# Patient Record
Sex: Female | Born: 1965 | Race: Asian | Hispanic: No | Smoking: Former smoker
Health system: Southern US, Community
[De-identification: ages and names within clinical notes are randomized; demographics above are authoritative.]

## PROBLEM LIST (undated history)

## (undated) DIAGNOSIS — C801 Malignant (primary) neoplasm, unspecified: Principal | ICD-10-CM

## (undated) DIAGNOSIS — M1990 Unspecified osteoarthritis, unspecified site: Secondary | ICD-10-CM

## (undated) HISTORY — PX: SHOULDER ARTHROTOMY: SHX1050

## (undated) HISTORY — PX: TONSILLECTOMY: SUR1361

## (undated) HISTORY — DX: Unspecified osteoarthritis, unspecified site: M19.90

## (undated) HISTORY — DX: Malignant (primary) neoplasm, unspecified: C80.1

## (undated) HISTORY — PX: APPENDECTOMY: SHX54

---

## 2012-01-22 ENCOUNTER — Emergency Department (HOSPITAL_COMMUNITY): Payer: Self-pay

## 2012-01-22 ENCOUNTER — Telehealth: Payer: Self-pay | Admitting: *Deleted

## 2012-01-22 ENCOUNTER — Emergency Department (HOSPITAL_COMMUNITY)
Admission: EM | Admit: 2012-01-22 | Discharge: 2012-01-22 | Disposition: A | Discharge status: Home / Self Care | Payer: Self-pay | Attending: Emergency Medicine | Admitting: Emergency Medicine

## 2012-01-22 ENCOUNTER — Encounter (HOSPITAL_COMMUNITY): Payer: Self-pay | Admitting: *Deleted

## 2012-01-22 DIAGNOSIS — R000 Tachycardia, unspecified: Secondary | ICD-10-CM | POA: Insufficient documentation

## 2012-01-22 DIAGNOSIS — C7951 Secondary malignant neoplasm of bone: Secondary | ICD-10-CM | POA: Insufficient documentation

## 2012-01-22 DIAGNOSIS — C801 Malignant (primary) neoplasm, unspecified: Secondary | ICD-10-CM | POA: Insufficient documentation

## 2012-01-22 DIAGNOSIS — C50919 Malignant neoplasm of unspecified site of unspecified female breast: Secondary | ICD-10-CM | POA: Insufficient documentation

## 2012-01-22 DIAGNOSIS — C787 Secondary malignant neoplasm of liver and intrahepatic bile duct: Secondary | ICD-10-CM | POA: Insufficient documentation

## 2012-01-22 DIAGNOSIS — E119 Type 2 diabetes mellitus without complications: Secondary | ICD-10-CM | POA: Insufficient documentation

## 2012-01-22 DIAGNOSIS — R030 Elevated blood-pressure reading, without diagnosis of hypertension: Secondary | ICD-10-CM | POA: Insufficient documentation

## 2012-01-22 DIAGNOSIS — R109 Unspecified abdominal pain: Secondary | ICD-10-CM | POA: Insufficient documentation

## 2012-01-22 LAB — URINE MICROSCOPIC-ADD ON

## 2012-01-22 LAB — DIFFERENTIAL
Basophils Relative: 1 % (ref 0–1)
Eosinophils Absolute: 0.3 10*3/uL (ref 0.0–0.7)
Lymphs Abs: 1.7 10*3/uL (ref 0.7–4.0)
Neutro Abs: 5.8 10*3/uL (ref 1.7–7.7)
Neutrophils Relative %: 69 % (ref 43–77)

## 2012-01-22 LAB — URINALYSIS, ROUTINE W REFLEX MICROSCOPIC
Nitrite: NEGATIVE
Specific Gravity, Urine: 1.02 (ref 1.005–1.030)
Urobilinogen, UA: 1 mg/dL (ref 0.0–1.0)
pH: 5.5 (ref 5.0–8.0)

## 2012-01-22 LAB — HEPATIC FUNCTION PANEL
Alkaline Phosphatase: 221 U/L — ABNORMAL HIGH (ref 39–117)
Indirect Bilirubin: 0.4 mg/dL (ref 0.3–0.9)
Total Protein: 6.5 g/dL (ref 6.0–8.3)

## 2012-01-22 LAB — BASIC METABOLIC PANEL
Chloride: 98 mEq/L (ref 96–112)
GFR calc Af Amer: >90 mL/min (ref >90)
GFR calc non Af Amer: >90 mL/min (ref >90)
Potassium: 4.1 mEq/L (ref 3.5–5.1)
Sodium: 135 mEq/L (ref 135–145)

## 2012-01-22 LAB — CBC
Hemoglobin: 15.3 g/dL — ABNORMAL HIGH (ref 12.0–15.0)
MCH: 30.2 pg (ref 26.0–34.0)
MCHC: 35.1 g/dL (ref 30.0–36.0)
Platelets: 246 10*3/uL (ref 150–400)
RBC: 5.06 MIL/uL (ref 3.87–5.11)

## 2012-01-22 LAB — POCT PREGNANCY, URINE: Preg Test, Ur: NEGATIVE

## 2012-01-22 MED ORDER — ONDANSETRON HCL 4 MG/2ML IJ SOLN
4.0000 mg | Freq: Once | INTRAMUSCULAR | Status: AC
  Administered 2012-01-22: 4 mg via INTRAVENOUS
  Filled 2012-01-22: qty 2

## 2012-01-22 MED ORDER — IOHEXOL 300 MG/ML  SOLN
80.0000 mL | Freq: Once | INTRAMUSCULAR | Status: AC | PRN
  Administered 2012-01-22: 80 mL via INTRAVENOUS

## 2012-01-22 MED ORDER — IOHEXOL 300 MG/ML  SOLN
20.0000 mL | INTRAMUSCULAR | Status: AC
  Administered 2012-01-22 (×2): 20 mL via ORAL

## 2012-01-22 MED ORDER — SODIUM CHLORIDE 0.9 % IV SOLN
Freq: Once | INTRAVENOUS | Status: AC
  Administered 2012-01-22: 125 mL/h via INTRAVENOUS

## 2012-01-22 MED ORDER — OXYCODONE-ACETAMINOPHEN 5-325 MG PO TABS: 1.0000 | ORAL_TABLET | ORAL | Status: DC | PRN

## 2012-01-22 MED ORDER — HYDROMORPHONE HCL PF 1 MG/ML IJ SOLN
1.0000 mg | Freq: Once | INTRAMUSCULAR | Status: AC
  Administered 2012-01-22: 1 mg via INTRAVENOUS
  Filled 2012-01-22: qty 1

## 2012-01-22 NOTE — ED Provider Notes (Addendum)
History     CSN: [id number]  Arrival date & time 01/22/12  0806   First MD Initiated Contact with Patient 01/22/12 [phone]      Chief Complaint  Patient presents with  . Flank Pain    (Consider location/radiation/quality/duration/timing/severity/associated sxs/prior treatment) Patient is a 46 y.o. female presenting with flank pain. The history is provided by the patient.  Flank Pain  She was awakened at 3 AM by severe pain in the right flank. Pain is dull and throbbing and does not radiate. There is no associated fever, chills, sweats. She denies nausea, vomiting, diarrhea. She denies dysuria. She's never had pain like this before. Nothing makes it better nothing makes it worse. She rates the pain at 8/10. She took a dose of Aleve with no benefit.  Past Medical History  Diagnosis Date  . Diabetes mellitus     Past Surgical History  Procedure Date  . Shoulder arthrotomy   . Tonsillectomy   . Appendectomy   . Cesarean section     No family history on file.  History  Substance Use Topics  . Smoking status: Not on file  . Smokeless tobacco: Not on file  . Alcohol Use: No    OB History    Grav Para Term Preterm Abortions TAB SAB Ect Mult Living                  Review of Systems  Genitourinary: Positive for flank pain.  All other systems reviewed and are negative.    Allergies  Cephalosporins  Home Medications  No current outpatient prescriptions on file.  BP 171/83  Pulse 102  Temp(Src) 98.2 F (36.8 C) (Oral)  Resp 18  SpO2 97%  Physical Exam  Nursing note and vitals reviewed.  46 year old female appears uncomfortable. Vital signs are significant for mild tachycardia with heart rate of 102, and moderate hypertension with blood pressure 171/83. Oxygen saturation is 97% which is normal. Head is normocephalic and atraumatic. PERRLA, EOMI. Oropharynx is clear. Neck is nontender and supple. Back has no midline tenderness but there is moderate right CVA  tenderness. Lungs are clear without rales, wheezes, rhonchi. Heart has regular rate rhythm without murmur. Abdomen is soft, flat, nontender without masses or hepatosplenomegaly. Peristalsis is decreased. Extremities have full range of motion, no cyanosis or edema. Skin is warm and dry without rash. Neurologic: Mental status is normal, cranial nerves are intact, there are no focal motor or sensory deficits.  ED Course  Procedures (including critical care time)  Results for orders placed during the hospital encounter of 01/22/12  CBC      Component Value Range   WBC 8.5  4.0 - 10.5 (K/uL)   RBC 5.06  3.87 - 5.11 (MIL/uL)   Hemoglobin 15.3 (*) 12.0 - 15.0 (g/dL)   HCT 11.9  14.7 - 82.9 (%)   MCV 86.2  78.0 - 100.0 (fL)   MCH 30.2  26.0 - 34.0 (pg)   MCHC 35.1  30.0 - 36.0 (g/dL)   RDW 56.2  13.0 - 86.5 (%)   Platelets 246  150 - 400 (K/uL)  DIFFERENTIAL      Component Value Range   Neutrophils Relative 69  43 - 77 (%)   Neutro Abs 5.8  1.7 - 7.7 (K/uL)   Lymphocytes Relative 21  12 - 46 (%)   Lymphs Abs 1.7  0.7 - 4.0 (K/uL)   Monocytes Relative 7  3 - 12 (%)   Monocytes Absolute 0.6  0.1 -  1.0 (K/uL)   Eosinophils Relative 3  0 - 5 (%)   Eosinophils Absolute 0.3  0.0 - 0.7 (K/uL)   Basophils Relative 1  0 - 1 (%)   Basophils Absolute 0.1  0.0 - 0.1 (K/uL)  BASIC METABOLIC PANEL      Component Value Range   Sodium 135  135 - 145 (mEq/L)   Potassium 4.1  3.5 - 5.1 (mEq/L)   Chloride 98  96 - 112 (mEq/L)   CO2 24  19 - 32 (mEq/L)   Glucose, Bld 251 (*) 70 - 99 (mg/dL)   BUN 12  6 - 23 (mg/dL)   Creatinine, Ser 4.09  0.50 - 1.10 (mg/dL)   Calcium 9.7  8.4 - 81.1 (mg/dL)   GFR calc non Af Amer >90  >90 (mL/min)   GFR calc Af Amer >90  >90 (mL/min)  URINALYSIS, ROUTINE W REFLEX MICROSCOPIC      Component Value Range   Color, Urine AMBER (*) YELLOW    APPearance CLOUDY (*) CLEAR    Specific Gravity, Urine 1.020  1.005 - 1.030    pH 5.5  5.0 - 8.0    Glucose, UA 500 (*) NEGATIVE  (mg/dL)   Hgb urine dipstick NEGATIVE  NEGATIVE    Bilirubin Urine SMALL (*) NEGATIVE    Ketones, ur NEGATIVE  NEGATIVE (mg/dL)   Protein, ur NEGATIVE  NEGATIVE (mg/dL)   Urobilinogen, UA 1.0  0.0 - 1.0 (mg/dL)   Nitrite NEGATIVE  NEGATIVE    Leukocytes, UA SMALL (*) NEGATIVE   POCT PREGNANCY, URINE      Component Value Range   Preg Test, Ur NEGATIVE  NEGATIVE   HEPATIC FUNCTION PANEL      Component Value Range   Total Protein 6.5  6.0 - 8.3 (g/dL)   Albumin 3.1 (*) 3.5 - 5.2 (g/dL)   AST 60 (*) 0 - 37 (U/L)   ALT 38 (*) 0 - 35 (U/L)   Alkaline Phosphatase 221 (*) 39 - 117 (U/L)   Total Bilirubin 0.6  0.3 - 1.2 (mg/dL)   Bilirubin, Direct 0.2  0.0 - 0.3 (mg/dL)   Indirect Bilirubin 0.4  0.3 - 0.9 (mg/dL)  URINE MICROSCOPIC-ADD ON      Component Value Range   Squamous Epithelial / LPF MANY (*) RARE    WBC, UA 7-10  <3 (WBC/hpf)   RBC / HPF 0-2  <3 (RBC/hpf)   Bacteria, UA RARE  RARE    Urine-Other MUCOUS PRESENT     Ct Abdomen Pelvis Wo Contrast  01/22/2012  *RADIOLOGY REPORT*  Clinical Data: Right flank pain, rule out kidney stone  CT ABDOMEN AND PELVIS WITHOUT CONTRAST  Technique:  Multidetector CT imaging of the abdomen and pelvis was performed following the standard protocol without intravenous contrast.  Comparison: Pain  Findings: The lung bases are unremarkable.  Sagittal images of the spine shows mild degenerative changes.  There is a lytic lesion in the L1 vertebral body measures 8 mm.  Lytic lesion in the L2 vertebral body measures 10 mm.  The small lytic lesion in the S3 vertebral body measures 5 mm.  Lytic lesion in the L4 vertebral body measures 7 mm.  Clinical correlation is necessary to exclude metastatic disease.  Multiple low density lesions are noted throughout the liver is suspicious for metastatic disease.  No calcified gallstones are noted within gallbladder.  Kidneys are symmetrical in size.  No nephrolithiasis.  No hydronephrosis or hydroureter.  No calcified  ureteral calculi are  noted. The pancreas, spleen and adrenal glands are unremarkable.  No small bowel obstruction.  No ascites or free air.  No adenopathy.  There is no pericecal inflammation.  The unenhanced uterus and adnexa are unremarkable.  No colonic obstruction.  The urinary bladder is under distended grossly unremarkable. Question lytic lesion right sacrum measures 1.7 cm.  IMPRESSION:  1.Multiple low density lesions within liver highly suspicious for metastatic disease.  Correlation with enhanced CT or MRI is recommended. 2. There are lytic lesions multiple vertebral body highly suspicious for metastatic disease.  Correlation with bone scan is recommended. Question lytic lesion right sacrum. 3.  No nephrolithiasis.  No hydronephrosis or hydroureter. 4.  No pericecal inflammation.  I discussed immediately with Dr. Preston Fleeting  Original Report Authenticated By: Natasha Mead, M.D.   Ct Chest W Contrast  01/22/2012  *RADIOLOGY REPORT*  Clinical Data:  Liver lesions  CT CHEST, ABDOMEN AND PELVIS WITH CONTRAST  Technique:  Multidetector CT imaging of the chest, abdomen and pelvis was performed following the standard protocol during bolus administration of intravenous contrast.  Contrast: 80mL OMNIPAQUE IOHEXOL 300 MG/ML  SOLN, 1 OMNIPAQUE IOHEXOL 300 MG/ML  SOLN  Comparison:   None.  CT CHEST  Findings:  2.8 cm mass in the right breast with spiculated margins. Abnormal right axillary adenopathy.  14 mm right axillary node on image 11.  14 mm node in the right axilla on image #4.  Other smaller adjacent nodes are evident.  Negative abnormal mediastinal adenopathy.  No pleural effusion.  No pericardial effusion.  Mild diffuse esophageal wall thickening.  Irregular 11 mm irregular pulmonary parenchymal density with spiculations in the right middle lobe adjacent to the minor fissure.  Lytic lesion in the upper sternum on image 16.  Several lytic lesions throughout the thoracic vertebral bodies.  No evidence of pathologic  fracture.  No obvious epidural spread of tumor.  IMPRESSION: Right breast mass and associated right axillary adenopathy worrisome for breast carcinoma.  Multiple lytic bone lesions.  Irregular spiculated pulmonary parenchymal density in the right middle lobe.  This may also represent carcinoma.  CT ABDOMEN AND PELVIS  Findings:  Healing lesions are seen throughout the liver worrisome for metastatic disease.  Contour of the liver is nodular.  This is a nonspecific finding.  Enhancement of the spleen is somewhat heterogeneous.  Kidney is adrenal glands, pancreas are within normal limits.  No free fluid. Small para-aortic nodes are present.  1.6 cm short axis diameter portacaval node.  Other smaller celiac nodes are present.  Innumerable lytic bone lesions throughout the thoracolumbar spine. Lytic lesion in the left ischium on image 114.  Uterus is enlarged.  Bladder and adnexa are within normal limits.  IMPRESSION: Multiple liver lesions worrisome for metastatic disease.  Enlarged retroperitoneal nodes in the upper abdomen.  Multiple lytic bone lesions worrisome for metastatic disease.  Further workup in this patient can be achieved with mammography and PET CT.  Original Report Authenticated By: Donavan Burnet, M.D.   Ct Abdomen Pelvis W Contrast  01/22/2012  *RADIOLOGY REPORT*  Clinical Data:  Liver lesions  CT CHEST, ABDOMEN AND PELVIS WITH CONTRAST  Technique:  Multidetector CT imaging of the chest, abdomen and pelvis was performed following the standard protocol during bolus administration of intravenous contrast.  Contrast: 80mL OMNIPAQUE IOHEXOL 300 MG/ML  SOLN, 1 OMNIPAQUE IOHEXOL 300 MG/ML  SOLN  Comparison:   None.  CT CHEST  Findings:  2.8 cm mass in the right breast with spiculated margins.  Abnormal right axillary adenopathy.  14 mm right axillary node on image 11.  14 mm node in the right axilla on image #4.  Other smaller adjacent nodes are evident.  Negative abnormal mediastinal adenopathy.  No  pleural effusion.  No pericardial effusion.  Mild diffuse esophageal wall thickening.  Irregular 11 mm irregular pulmonary parenchymal density with spiculations in the right middle lobe adjacent to the minor fissure.  Lytic lesion in the upper sternum on image 16.  Several lytic lesions throughout the thoracic vertebral bodies.  No evidence of pathologic fracture.  No obvious epidural spread of tumor.  IMPRESSION: Right breast mass and associated right axillary adenopathy worrisome for breast carcinoma.  Multiple lytic bone lesions.  Irregular spiculated pulmonary parenchymal density in the right middle lobe.  This may also represent carcinoma.  CT ABDOMEN AND PELVIS  Findings:  Healing lesions are seen throughout the liver worrisome for metastatic disease.  Contour of the liver is nodular.  This is a nonspecific finding.  Enhancement of the spleen is somewhat heterogeneous.  Kidney is adrenal glands, pancreas are within normal limits.  No free fluid. Small para-aortic nodes are present.  1.6 cm short axis diameter portacaval node.  Other smaller celiac nodes are present.  Innumerable lytic bone lesions throughout the thoracolumbar spine. Lytic lesion in the left ischium on image 114.  Uterus is enlarged.  Bladder and adnexa are within normal limits.  IMPRESSION: Multiple liver lesions worrisome for metastatic disease.  Enlarged retroperitoneal nodes in the upper abdomen.  Multiple lytic bone lesions worrisome for metastatic disease.  Further workup in this patient can be achieved with mammography and PET CT.  Original Report Authenticated By: Donavan Burnet, M.D.      1. Flank pain   2. Breast cancer   3. Liver metastases   4. Bone metastases       MDM  Flank pain which may represent a kidney stone. She'll be given IV fluids, IV pain medication and CT scan will be obtained to evaluate for possible ureterolithiasis.  CT did not show evidence of ureterolithiasis. Radiologist noted evidence of liver  and bone metastases so CT was repeated with contrast and including the chest which showed a probable primary cancer in the right breast as well as possible lung metastasis. Patient has no PCP and is new to the area. I have contacted Dr. Truett Perna of oncology service who states that they can get her seen in the oncology clinic in the next week. She is sent home with a prescription for Percocet for pain.      Dione Booze, MD 01/22/12 1327  Patient was informed of the CT findings and stated she was aware of a breast lump for the last month and had been trying to get in to see a physician during that time but had been unable to do so.  Dione Booze, MD 01/22/12 1331

## 2012-01-22 NOTE — ED Notes (Signed)
Pt placed in gown, on monitor with continuous blood pressure and pulse oximetry 

## 2012-01-22 NOTE — ED Notes (Signed)
Pt was awakened by right sided flank pain at 0300. Pt denies difficult urination.

## 2012-01-22 NOTE — ED Notes (Signed)
Bag lunch given

## 2012-01-22 NOTE — Discharge Instructions (Signed)
Your workup shows that you have a breast cancer which has spread to your liver and bones. I have contacted Dr. Elnita Maxwell of our oncology service and they will contact you this week for an initial evaluation. In the meantime, take Percocet as needed for pain. Return to the emergency department if you have any problems.  Acetaminophen; Oxycodone tablets What is this medicine? ACETAMINOPHEN; OXYCODONE (a set a MEE noe fen; ox i KOE done) is a pain reliever. It is used to treat mild to moderate pain. This medicine may be used for other purposes; ask your health care provider or pharmacist if you have questions. What should I tell my health care provider before I take this medicine? They need to know if you have any of these conditions: -brain tumor -Crohn's disease, inflammatory bowel disease, or ulcerative colitis -drink more than 3 alcohol containing drinks per day -drug abuse or addiction -head injury -heart or circulation problems -kidney disease or problems going to the bathroom -liver disease -lung disease, asthma, or breathing problems -an unusual or allergic reaction to acetaminophen, oxycodone, other opioid analgesics, other medicines, foods, dyes, or preservatives -pregnant or trying to get pregnant -breast-feeding How should I use this medicine? Take this medicine by mouth with a full glass of water. Follow the directions on the prescription label. Take your medicine at regular intervals. Do not take your medicine more often than directed. Talk to your pediatrician regarding the use of this medicine in children. Special care may be needed. Patients over 14 years old may have a stronger reaction and need a smaller dose. Overdosage: If you think you have taken too much of this medicine contact a poison control center or emergency room at once. NOTE: This medicine is only for you. Do not share this medicine with others. What if I miss a dose? If you miss a dose, take it as soon as you  can. If it is almost time for your next dose, take only that dose. Do not take double or extra doses. What may interact with this medicine? -alcohol or medicines that contain alcohol -antihistamines -barbiturates like amobarbital, butalbital, butabarbital, methohexital, pentobarbital, phenobarbital, thiopental, and secobarbital -benztropine -drugs for bladder problems like solifenacin, trospium, oxybutynin, tolterodine, hyoscyamine, and methscopolamine -drugs for breathing problems like ipratropium and tiotropium -drugs for certain stomach or intestine problems like propantheline, homatropine methylbromide, glycopyrrolate, atropine, belladonna, and dicyclomine -general anesthetics like etomidate, ketamine, nitrous oxide, propofol, desflurane, enflurane, halothane, isoflurane, and sevoflurane -medicines for depression, anxiety, or psychotic disturbances -medicines for pain like codeine, morphine, pentazocine, buprenorphine, butorphanol, nalbuphine, tramadol, and propoxyphene -medicines for sleep -muscle relaxants -naltrexone -phenothiazines like perphenazine, thioridazine, chlorpromazine, mesoridazine, fluphenazine, prochlorperazine, promazine, and trifluoperazine -scopolamine -trihexyphenidyl This list may not describe all possible interactions. Give your health care provider a list of all the medicines, herbs, non-prescription drugs, or dietary supplements you use. Also tell them if you smoke, drink alcohol, or use illegal drugs. Some items may interact with your medicine. What should I watch for while using this medicine? Tell your doctor or health care professional if your pain does not go away, if it gets worse, or if you have new or a different type of pain. You may develop tolerance to the medicine. Tolerance means that you will need a higher dose of the medication for pain relief. Tolerance is normal and is expected if you take this medicine for a long time. Do not suddenly stop taking  your medicine because you may develop a severe reaction. Your body becomes  used to the medicine. This does NOT mean you are addicted. Addiction is a behavior related to getting and using a drug for a nonmedical reason. If you have pain, you have a medical reason to take pain medicine. Your doctor will tell you how much medicine to take. If your doctor wants you to stop the medicine, the dose will be slowly lowered over time to avoid any side effects. You may get drowsy or dizzy. Do not drive, use machinery, or do anything that needs mental alertness until you know how this medicine affects you. Do not stand or sit up quickly, especially if you are an older patient. This reduces the risk of dizzy or fainting spells. Alcohol may interfere with the effect of this medicine. Avoid alcoholic drinks. The medicine will cause constipation. Try to have a bowel movement at least every 2 to 3 days. If you do not have a bowel movement for 3 days, call your doctor or health care professional. Do not take Tylenol (acetaminophen) or medicines that have acetaminophen with this medicine. Too much acetaminophen can be very dangerous. Many nonprescription medicines contain acetaminophen. Always read the labels carefully to avoid taking more acetaminophen. What side effects may I notice from receiving this medicine? Side effects that you should report to your doctor or health care professional as soon as possible: -allergic reactions like skin rash, itching or hives, swelling of the face, lips, or tongue -breathing difficulties, wheezing -confusion -light headedness or fainting spells -severe stomach pain -yellowing of the skin or the whites of the eyes Side effects that usually do not require medical attention (report to your doctor or health care professional if they continue or are bothersome): -dizziness -drowsiness -nausea -vomiting This list may not describe all possible side effects. Call your doctor for medical  advice about side effects. You may report side effects to FDA at 1-800-FDA-1088. Where should I keep my medicine? Keep out of the reach of children. This medicine can be abused. Keep your medicine in a safe place to protect it from theft. Do not share this medicine with anyone. Selling or giving away this medicine is dangerous and against the law. Store at room temperature between 20 and 25 degrees C (68 and 77 degrees F). Keep container tightly closed. Protect from light. Flush any unused medicines down the toilet. Do not use the medicine after the expiration date. NOTE: This sheet is a summary. It may not cover all possible information. If you have questions about this medicine, talk to your doctor, pharmacist, or health care provider.  2012, [employer]. (07/20/2008 10:01:21 AM)

## 2012-01-22 NOTE — Telephone Encounter (Signed)
Gave pt appt fro 11/23/11 at 1:00 for registration and lab, 1:30 to see Dr. Welton Flakes.

## 2012-01-23 ENCOUNTER — Encounter: Payer: Self-pay | Admitting: Oncology

## 2012-01-23 ENCOUNTER — Other Ambulatory Visit: Payer: Self-pay | Admitting: Lab

## 2012-01-23 ENCOUNTER — Ambulatory Visit (HOSPITAL_BASED_OUTPATIENT_CLINIC_OR_DEPARTMENT_OTHER): Payer: Self-pay | Admitting: Oncology

## 2012-01-23 ENCOUNTER — Ambulatory Visit: Payer: Self-pay | Admitting: Lab

## 2012-01-23 ENCOUNTER — Ambulatory Visit: Payer: Self-pay

## 2012-01-23 VITALS — BP 139/81 | HR 89 | Temp 99.1°F | Ht 66.0 in | Wt 215.8 lb

## 2012-01-23 DIAGNOSIS — C801 Malignant (primary) neoplasm, unspecified: Secondary | ICD-10-CM

## 2012-01-23 HISTORY — DX: Malignant (primary) neoplasm, unspecified: C80.1

## 2012-01-23 LAB — CBC WITH DIFFERENTIAL/PLATELET
Basophils Absolute: 0 10*3/uL (ref 0.0–0.1)
EOS%: 2.9 % (ref 0.0–7.0)
HGB: 14 g/dL (ref 11.6–15.9)
MCH: 29.5 pg (ref 25.1–34.0)
MCHC: 33.3 g/dL (ref 31.5–36.0)
MCV: 88.6 fL (ref 79.5–101.0)
MONO%: 6.5 % (ref 0.0–14.0)
RDW: 13 % (ref 11.2–14.5)

## 2012-01-23 LAB — COMPREHENSIVE METABOLIC PANEL
Alkaline Phosphatase: 257 U/L — ABNORMAL HIGH (ref 39–117)
BUN: 12 mg/dL (ref 6–23)
Glucose, Bld: 338 mg/dL — ABNORMAL HIGH (ref 70–99)
Sodium: 134 mEq/L — ABNORMAL LOW (ref 135–145)
Total Bilirubin: 0.5 mg/dL (ref 0.3–1.2)
Total Protein: 6.9 g/dL (ref 6.0–8.3)

## 2012-01-23 LAB — CANCER ANTIGEN 27.29: CA 27.29: 98 U/mL — ABNORMAL HIGH (ref 0–39)

## 2012-01-23 MED ORDER — OXYCODONE HCL 5 MG PO TABS: 15.0000 mg | ORAL_TABLET | Freq: Three times a day (TID) | ORAL | Status: DC | PRN

## 2012-01-23 NOTE — Progress Notes (Signed)
Vicki Sanders [id number] 02/15/1966 46 y.o. 01/23/2012 2:44 PM  CC Dr. Dione Booze  REASON FOR CONSULTATION:   Work up for possible malignancy  STAGE:   No matching staging information was found for the patient.  REFERRING PHYSICIAN: Dr. Dione Booze  HISTORY OF PRESENT ILLNESS:  Vicki Sanders is a 46 y.o. female with history of chronic back pain for 15 years. Yesterday upon awakening had very severe pain not releived by [employer]. Pain located on the lower back side, No nausea or vomiting associated with it, not jaundiced. Patient has not had a mammogram at all. She had felt a mass in her right breast she tells me that it was for several months now but less than a year. She has not had any discharge from that mass no pain. Patient denies having any cough hemoptysis hematemesis. She is a current smoker. She denies any nausea vomiting abdominal pain no diarrhea or constipation no hematuria hematochezia or melena. She does not have any lower extremity swelling no peripheral paresthesias. Remainder of the 14 point review of systems is negative.   Past Medical History: Past Medical History  Diagnosis Date  . Diabetes mellitus   . Arthritis   . Cancer 01/23/2012    Past Surgical History: Past Surgical History  Procedure Date  . Shoulder arthrotomy   . Tonsillectomy   . Appendectomy   . Cesarean section     Family History: Family History  Problem Relation Age of Onset  . Cancer Mother 25    lung cancer  . Diabetes Father   . Diabetes Sister   . Cancer Maternal Aunt     bilateral cancers, mastectomy  . Cancer Paternal Grandmother     bone cancer    Social History: single with 2 children (27 and 9) History  Substance Use Topics  . Smoking status: Former Smoker -- 1.0 packs/day    Quit date: 01/23/2012  . Smokeless tobacco: Never Used  . Alcohol Use: No    Allergies: Allergies  Allergen Reactions  . Cephalosporins Hives and Itching    Current  Medications: Current Outpatient Prescriptions  Medication Sig Dispense Refill  . oxyCODONE-acetaminophen (PERCOCET) 5-325 MG per tablet Take 1 tablet by mouth every 4 (four) hours as needed for pain.  20 tablet  0    OB/GYN History: menarche at 60, premenopausal, LMP 12/27/11. [id number], first live born at 45 years  Fertility Discussion: completed family Prior History of Cancer: none  Health Maintenance:  Colonoscopy no Bone Density none Last PAP smear: 2010  ECOG PERFORMANCE STATUS: 1 - Symptomatic but completely ambulatory  Genetic Counseling/testing:  Patient will require genetic testing and will refer her as soon as a diagnosis of malignancy  REVIEW OF SYSTEMS:  Constitutional: positive for fatigue and headaches, no walking problems Ears, nose, mouth, throat, and face: negative Respiratory: negative Cardiovascular: negative Gastrointestinal: positive for dyspepsia, nausea and reflux symptoms Genitourinary:negative Integument/breast: positive for breast lump and breast tenderness Musculoskeletal:positive for arthralgias and back pain Neurological: positive for headaches and memory problems Endocrine: positive for diabetic symptoms including increased fatigue  PHYSICAL EXAMINATION: Blood pressure 139/81, pulse 89, temperature 99.1 F (37.3 C), temperature source Oral, height 5\' 6"  (1.676 m), weight 215 lb 12.8 oz (97.886 kg), last menstrual period 12/27/2011.  BJY:NWGNF, healthy, no distress and flushed SKIN: skin color, texture, turgor are normal HEAD: Normocephalic EYES: PERRLA, EOMI, sclera clear EARS: External ears normal OROPHARYNX:no exudate, no erythema and edentulous  NECK: supple, no adenopathy, thyroid normal size, non-tender, without nodularity  LYMPH:  enlarged lymph nodes palpated In the right axilla but there is no supraclavicular or left axillary lymph node no inguinal lymph node. BREAST: 3-4 cm palpable breast mass is noted in the right breast There is some  puckering of the skin there is no nipple discharge. No redness to the skin. Left breast no masses or nipple discharge. LUNGS: clear to auscultation and percussion HEART: regular rate & rhythm, no murmurs and no gallops ABDOMEN:abdomen soft, non-tender, obese, normal bowel sounds and no masses or organomegaly BACK: Back symmetric, no curvature., No CVA tenderness EXTREMITIES:no edema, no clubbing, no cyanosis  NEURO: alert & oriented x 3 with fluent speech, no focal motor/sensory deficits, gait normal, reflexes normal and symmetric  STUDIES/RESULTS: Ct Abdomen Pelvis Wo Contrast  01/22/2012  *RADIOLOGY REPORT*  Clinical Data: Right flank pain, rule out kidney stone  CT ABDOMEN AND PELVIS WITHOUT CONTRAST  Technique:  Multidetector CT imaging of the abdomen and pelvis was performed following the standard protocol without intravenous contrast.  Comparison: Pain  Findings: The lung bases are unremarkable.  Sagittal images of the spine shows mild degenerative changes.  There is a lytic lesion in the L1 vertebral body measures 8 mm.  Lytic lesion in the L2 vertebral body measures 10 mm.  The small lytic lesion in the S3 vertebral body measures 5 mm.  Lytic lesion in the L4 vertebral body measures 7 mm.  Clinical correlation is necessary to exclude metastatic disease.  Multiple low density lesions are noted throughout the liver is suspicious for metastatic disease.  No calcified gallstones are noted within gallbladder.  Kidneys are symmetrical in size.  No nephrolithiasis.  No hydronephrosis or hydroureter.  No calcified ureteral calculi are noted. The pancreas, spleen and adrenal glands are unremarkable.  No small bowel obstruction.  No ascites or free air.  No adenopathy.  There is no pericecal inflammation.  The unenhanced uterus and adnexa are unremarkable.  No colonic obstruction.  The urinary bladder is under distended grossly unremarkable. Question lytic lesion right sacrum measures 1.7 cm.  IMPRESSION:   1.Multiple low density lesions within liver highly suspicious for metastatic disease.  Correlation with enhanced CT or MRI is recommended. 2. There are lytic lesions multiple vertebral body highly suspicious for metastatic disease.  Correlation with bone scan is recommended. Question lytic lesion right sacrum. 3.  No nephrolithiasis.  No hydronephrosis or hydroureter. 4.  No pericecal inflammation.  I discussed immediately with Dr. Preston Fleeting  Original Report Authenticated By: Natasha Mead, M.D.   Ct Chest W Contrast  01/22/2012  *RADIOLOGY REPORT*  Clinical Data:  Liver lesions  CT CHEST, ABDOMEN AND PELVIS WITH CONTRAST  Technique:  Multidetector CT imaging of the chest, abdomen and pelvis was performed following the standard protocol during bolus administration of intravenous contrast.  Contrast: 80mL OMNIPAQUE IOHEXOL 300 MG/ML  SOLN, 1 OMNIPAQUE IOHEXOL 300 MG/ML  SOLN  Comparison:   None.  CT CHEST  Findings:  2.8 cm mass in the right breast with spiculated margins. Abnormal right axillary adenopathy.  14 mm right axillary node on image 11.  14 mm node in the right axilla on image #4.  Other smaller adjacent nodes are evident.  Negative abnormal mediastinal adenopathy.  No pleural effusion.  No pericardial effusion.  Mild diffuse esophageal wall thickening.  Irregular 11 mm irregular pulmonary parenchymal density with spiculations in the right middle lobe adjacent to the minor fissure.  Lytic lesion in the upper sternum on image 16.  Several lytic lesions throughout  the thoracic vertebral bodies.  No evidence of pathologic fracture.  No obvious epidural spread of tumor.  IMPRESSION: Right breast mass and associated right axillary adenopathy worrisome for breast carcinoma.  Multiple lytic bone lesions.  Irregular spiculated pulmonary parenchymal density in the right middle lobe.  This may also represent carcinoma.  CT ABDOMEN AND PELVIS  Findings:  Healing lesions are seen throughout the liver worrisome for  metastatic disease.  Contour of the liver is nodular.  This is a nonspecific finding.  Enhancement of the spleen is somewhat heterogeneous.  Kidney is adrenal glands, pancreas are within normal limits.  No free fluid. Small para-aortic nodes are present.  1.6 cm short axis diameter portacaval node.  Other smaller celiac nodes are present.  Innumerable lytic bone lesions throughout the thoracolumbar spine. Lytic lesion in the left ischium on image 114.  Uterus is enlarged.  Bladder and adnexa are within normal limits.  IMPRESSION: Multiple liver lesions worrisome for metastatic disease.  Enlarged retroperitoneal nodes in the upper abdomen.  Multiple lytic bone lesions worrisome for metastatic disease.  Further workup in this patient can be achieved with mammography and PET CT.  Original Report Authenticated By: Donavan Burnet, M.D.   Ct Abdomen Pelvis W Contrast  01/22/2012  *RADIOLOGY REPORT*  Clinical Data:  Liver lesions  CT CHEST, ABDOMEN AND PELVIS WITH CONTRAST  Technique:  Multidetector CT imaging of the chest, abdomen and pelvis was performed following the standard protocol during bolus administration of intravenous contrast.  Contrast: 80mL OMNIPAQUE IOHEXOL 300 MG/ML  SOLN, 1 OMNIPAQUE IOHEXOL 300 MG/ML  SOLN  Comparison:   None.  CT CHEST  Findings:  2.8 cm mass in the right breast with spiculated margins. Abnormal right axillary adenopathy.  14 mm right axillary node on image 11.  14 mm node in the right axilla on image #4.  Other smaller adjacent nodes are evident.  Negative abnormal mediastinal adenopathy.  No pleural effusion.  No pericardial effusion.  Mild diffuse esophageal wall thickening.  Irregular 11 mm irregular pulmonary parenchymal density with spiculations in the right middle lobe adjacent to the minor fissure.  Lytic lesion in the upper sternum on image 16.  Several lytic lesions throughout the thoracic vertebral bodies.  No evidence of pathologic fracture.  No obvious epidural spread of  tumor.  IMPRESSION: Right breast mass and associated right axillary adenopathy worrisome for breast carcinoma.  Multiple lytic bone lesions.  Irregular spiculated pulmonary parenchymal density in the right middle lobe.  This may also represent carcinoma.  CT ABDOMEN AND PELVIS  Findings:  Healing lesions are seen throughout the liver worrisome for metastatic disease.  Contour of the liver is nodular.  This is a nonspecific finding.  Enhancement of the spleen is somewhat heterogeneous.  Kidney is adrenal glands, pancreas are within normal limits.  No free fluid. Small para-aortic nodes are present.  1.6 cm short axis diameter portacaval node.  Other smaller celiac nodes are present.  Innumerable lytic bone lesions throughout the thoracolumbar spine. Lytic lesion in the left ischium on image 114.  Uterus is enlarged.  Bladder and adnexa are within normal limits.  IMPRESSION: Multiple liver lesions worrisome for metastatic disease.  Enlarged retroperitoneal nodes in the upper abdomen.  Multiple lytic bone lesions worrisome for metastatic disease.  Further workup in this patient can be achieved with mammography and PET CT.  Original Report Authenticated By: Donavan Burnet, M.D.     LABS:    Chemistry  Component Value Date/Time   NA 135 01/22/2012 0825   K 4.1 01/22/2012 0825   CL 98 01/22/2012 0825   CO2 24 01/22/2012 0825   BUN 12 01/22/2012 0825   CREATININE 0.59 01/22/2012 0825      Component Value Date/Time   CALCIUM 9.7 01/22/2012 0825   ALKPHOS 221* 01/22/2012 0923   AST 60* 01/22/2012 0923   ALT 38* 01/22/2012 0923   BILITOT 0.6 01/22/2012 0923      Lab Results  Component Value Date   WBC 8.5 01/22/2012   HGB 15.3* 01/22/2012   HCT 43.6 01/22/2012   MCV 86.2 01/22/2012   PLT 246 01/22/2012       PATHOLOGY: There is no pathology available yet but I have set the patient up for biopsy of the right breast mass  ASSESSMENT    46 year old female with  #1 right breast mass and right  axillary lymph node. Patient has never had a mammogram done in the past. She  most likely has a primary breast cancer. However further studies do need to be performed.  #2 patient was seen in the emergency room on 01/22/2012 with a pain subsequent CT scans performed revealed possibility of metastatic disease to the liver bones and lungs. This certainly could be from her breast.  #3 patient needs a tissue diagnosis. And further workup.     PLAN:    #1 I had an extensive discussion with the patient today greater than 60 minutes. We discussed and went over in detail the CT scan findings. We also discussed the differential diagnosis. I do think the patient most likely has a primary breast malignancy with metastasis to the liver and bones. However we do need to make a tissue diagnosis.  #2 patient will be set up for mammograms of the breasts these will be diagnostic mammograms with ultrasound guided biopsy of the right breast mass as well as the axillary lymph node. She will need an MRI of the breasts as well before any contralateral disease.  #3 I have recommended PET scan this for staging extent of disease.  #4 patient is a smoker we discussed smoking cessation.  #5 patient is in significant pain we just gave her prescription for oxycodone.  #6 patient will be seen back after she has had a tissue diagnosis and we will discuss her treatment options. But theoretically we did discuss the possibilities of what this is and how we will be treated which most likely would be chemotherapy.  #7 I have also referred her to Dr. Emelia Loron in case we do need any kind of surgeries in the future.       Thank you so much for allowing me to participate in the care of Vicki Sanders. I will continue to follow up the patient with you and assist in her care.  All questions were answered. The patient knows to call the clinic with any problems, questions or concerns. We can certainly see the patient much  sooner if necessary.  I spent > 60 minutes counseling the patient face to face. The total time spent in the appointment was 60 minutes.   Drue Second, MD Medical/Oncology [facility] [phone] (beeper) [phone] (Office)  01/23/2012, 2:44 PM

## 2012-01-23 NOTE — Patient Instructions (Signed)
1. New diagnosis of possible breast cancer but we need a biopsy  2. PET scan  3. Mammogram and ultrasound with biopsy of right breast mass  4. Refer to Dr. Dwain Sarna  5. I will see you back after we have the results of the biopsy to discuss the specific treatments  6. Oxycodone for pain control

## 2012-01-24 ENCOUNTER — Encounter: Payer: Self-pay | Admitting: Oncology

## 2012-01-24 ENCOUNTER — Telehealth: Payer: Self-pay | Admitting: *Deleted

## 2012-01-24 NOTE — Progress Notes (Signed)
New patient, patient accompanied by her boyfriend, patient had no insurance, gave patient medicaid application and EPP financial application. Patient will get EPP application back to me as soon as possible. Patient was very emotional from the news that she had received from the hospital. Boyfriend was very supportive.

## 2012-01-24 NOTE — Telephone Encounter (Signed)
gave patient appointment for pet scan 01-31-2012 arrival time 1:15pm patient has no insurance called breast center to ask for the patient the get and grant

## 2012-01-26 ENCOUNTER — Telehealth: Payer: Self-pay | Admitting: *Deleted

## 2012-01-26 ENCOUNTER — Encounter: Payer: Self-pay | Admitting: *Deleted

## 2012-01-26 NOTE — Progress Notes (Signed)
Mailed after appt letter to pt. 

## 2012-01-26 NOTE — Telephone Encounter (Signed)
per the breast center 02-02-2012 patient will have ultrasound and mammogram done at 8:30am printed out calendar and gave to the patient

## 2012-01-31 ENCOUNTER — Encounter (HOSPITAL_COMMUNITY)
Admission: RE | Admit: 2012-01-31 | Discharge: 2012-01-31 | Disposition: A | Discharge status: Home / Self Care | Payer: Self-pay | Source: Ambulatory Visit | Attending: Oncology | Admitting: Oncology

## 2012-01-31 DIAGNOSIS — C50919 Malignant neoplasm of unspecified site of unspecified female breast: Secondary | ICD-10-CM | POA: Insufficient documentation

## 2012-01-31 DIAGNOSIS — C7951 Secondary malignant neoplasm of bone: Secondary | ICD-10-CM | POA: Insufficient documentation

## 2012-01-31 DIAGNOSIS — N63 Unspecified lump in unspecified breast: Secondary | ICD-10-CM | POA: Insufficient documentation

## 2012-01-31 DIAGNOSIS — C787 Secondary malignant neoplasm of liver and intrahepatic bile duct: Secondary | ICD-10-CM | POA: Insufficient documentation

## 2012-01-31 DIAGNOSIS — C801 Malignant (primary) neoplasm, unspecified: Secondary | ICD-10-CM

## 2012-01-31 DIAGNOSIS — C773 Secondary and unspecified malignant neoplasm of axilla and upper limb lymph nodes: Secondary | ICD-10-CM | POA: Insufficient documentation

## 2012-01-31 DIAGNOSIS — C772 Secondary and unspecified malignant neoplasm of intra-abdominal lymph nodes: Secondary | ICD-10-CM | POA: Insufficient documentation

## 2012-01-31 LAB — GLUCOSE, CAPILLARY: Glucose-Capillary: 228 mg/dL — ABNORMAL HIGH (ref 70–99)

## 2012-01-31 MED ORDER — FLUDEOXYGLUCOSE F - 18 (FDG) INJECTION
16.8000 | Freq: Once | INTRAVENOUS | Status: AC | PRN
  Administered 2012-01-31: 16.8 via INTRAVENOUS

## 2012-02-01 ENCOUNTER — Telehealth: Payer: Self-pay | Admitting: Oncology

## 2012-02-01 ENCOUNTER — Encounter: Payer: Self-pay | Admitting: Oncology

## 2012-02-01 ENCOUNTER — Telehealth: Payer: Self-pay | Admitting: *Deleted

## 2012-02-01 NOTE — Telephone Encounter (Signed)
Pt called states " I just took my last pain pill Dr. Welton Flakes prescribed for my back pain.  I called the pharmacy to try and get it refilled but they told me to call the office." Pt seen on 05/21- Pt advised PET scan 05/29, Mammo 05/31, Dr. Dwain Sarna 6/3.  Pt to be see MD after all tests are completed. Pt was given application for possible financial assistance.  LMOVM for Pat Kocher regarding status.  Discussed with Pt MD out of office, will review with Harriett Sine, NP.  Due to this being a controlled substance pt may need to come and pick up Prescription at [facility]. Pt verbalized understanding Will review with NR

## 2012-02-01 NOTE — Telephone Encounter (Signed)
Spoke with patient, patient brought EPP application by and dropped it off on yesterday to front desk personnel. I will process application and let [facility] Dr. Milta Deiters nurse know.

## 2012-02-01 NOTE — Progress Notes (Signed)
Patient approved for 100% EPP discount for family of 2 income [phone]

## 2012-02-01 NOTE — Telephone Encounter (Addendum)
Reviewed with NR, Ok to refill Rx Oxycodone 5/325 Q# 60 no refills.  Will review with MD regarding appt f/u date &Time.

## 2012-02-02 ENCOUNTER — Ambulatory Visit
Admission: RE | Admit: 2012-02-02 | Discharge: 2012-02-02 | Disposition: A | Discharge status: Home / Self Care | Payer: Self-pay | Source: Ambulatory Visit | Attending: Oncology | Admitting: Oncology

## 2012-02-02 DIAGNOSIS — C801 Malignant (primary) neoplasm, unspecified: Secondary | ICD-10-CM

## 2012-02-02 MED ORDER — OXYCODONE HCL 5 MG PO TABS: 15.0000 mg | ORAL_TABLET | Freq: Three times a day (TID) | ORAL | Status: DC | PRN

## 2012-02-02 MED ORDER — OXYCODONE-ACETAMINOPHEN 5-325 MG PO TABS: 1.0000 | ORAL_TABLET | ORAL | Status: DC | PRN

## 2012-02-02 NOTE — Telephone Encounter (Signed)
Called to advise pt Rx for Oxycodone 5/325 ready at front desk for pick up. Pt states " I cant take the Oxycodone with Tylenol because of my liver. Dr. Welton Flakes gave me the oxycodone with out the tylenol." Reviewed with NR. D/C'd Rx for Oxycodone 5/325. Rx for Oxycodone HCL 5mg  Informed pt rx can be picked up this am.To bring her ID in order to pick up medication. If pt unable to pick up medication, person picking up meds must be on her [employer] form and must present ID. Pt verbalized understanding.

## 2012-02-05 ENCOUNTER — Ambulatory Visit (INDEPENDENT_AMBULATORY_CARE_PROVIDER_SITE_OTHER): Payer: Self-pay | Admitting: General Surgery

## 2012-02-05 ENCOUNTER — Encounter (INDEPENDENT_AMBULATORY_CARE_PROVIDER_SITE_OTHER): Payer: Self-pay | Admitting: General Surgery

## 2012-02-05 VITALS — BP 112/80 | HR 107 | Temp 97.6°F | Resp 14 | Ht 64.0 in | Wt 212.4 lb

## 2012-02-05 DIAGNOSIS — C801 Malignant (primary) neoplasm, unspecified: Secondary | ICD-10-CM

## 2012-02-05 NOTE — Progress Notes (Signed)
Patient ID: Vicki Sanders, female   DOB: [birth year], 46 y.o.   MRN: [id number]  Chief Complaint  Patient presents with  . New Evaluation    New Br Ca Pt. Eval Rt. Br    HPI Vicki Sanders is a 46 y.o. female.  Referred by Dr. Drue Second HPI This is a 46 year old female has a history of back pain who was recently seen in the emergency room for increasing back pain. She also states that she has had a right breast mass that has been present for several months. This is not really changed over that time. She underwent evaluation in the emergency room which appeared to show that she had metastatic cancer. She also complains of some nausea and decreased appetite. She also complains of very significant back pain that is difficult to control with some of her pain medication. She has now been evaluated with mammogram and ultrasound that show any right breast mass as well as right axillary adenopathy. She also has undergone a systemic evaluation which shows her to have what appears to be metastatic lesions in her liver as well as multiple vertebral body lytic lesions that appear consistent with metastases. She also has a fair amount of retroperitoneal adenopathy in her upper abdomen as well. She has not undergone a tissue diagnosis but she tells me this is been set up for next week.  Past Medical History  Diagnosis Date  . Diabetes mellitus   . Arthritis   . Cancer 01/23/2012    Past Surgical History  Procedure Date  . Shoulder arthrotomy   . Tonsillectomy   . Appendectomy   . Cesarean section     Family History  Problem Relation Age of Onset  . Cancer Mother 26    lung cancer  . Diabetes Father   . Diabetes Sister   . Cancer Maternal Aunt     bilateral cancers, mastectomy  . Cancer Paternal Grandmother     bone cancer    Social History History  Substance Use Topics  . Smoking status: Former Smoker -- 1.0 packs/day    Quit date: 01/23/2012  . Smokeless tobacco: Never Used  .  Alcohol Use: No    Allergies  Allergen Reactions  . Cephalosporins Hives and Itching    Current Outpatient Prescriptions  Medication Sig Dispense Refill  . oxyCODONE (OXY IR/ROXICODONE) 5 MG immediate release tablet Take 3 tablets (15 mg total) by mouth every 8 (eight) hours as needed for pain.  40 tablet  0    Review of Systems Review of Systems  Constitutional: Negative for fever, chills and unexpected weight change.  HENT: Negative for hearing loss, congestion, sore throat, trouble swallowing and voice change.   Eyes: Negative for visual disturbance.  Respiratory: Negative for cough and wheezing.   Cardiovascular: Negative for chest pain, palpitations and leg swelling.  Gastrointestinal: Positive for nausea and constipation. Negative for vomiting, abdominal pain, diarrhea, blood in stool, abdominal distention and anal bleeding.  Genitourinary: Negative for hematuria, vaginal bleeding and difficulty urinating.  Musculoskeletal: Negative for arthralgias.  Skin: Negative for rash and wound.  Neurological: Positive for weakness. Negative for seizures, syncope and headaches.  Hematological: Negative for adenopathy. Does not bruise/bleed easily.  Psychiatric/Behavioral: Negative for confusion.    Blood pressure 112/80, pulse 107, temperature 97.6 F (36.4 C), temperature source Temporal, resp. rate 14, height 5\' 4"  (1.626 m), weight 212 lb 6.4 oz (96.344 kg), last menstrual period 01/25/2012.  Physical Exam Physical Exam  Vitals reviewed. Constitutional: She  appears lethargic. She appears ill.  Pulmonary/Chest: Right breast exhibits mass. Right breast exhibits no inverted nipple, no nipple discharge, no skin change and no tenderness. Left breast exhibits no inverted nipple, no mass, no nipple discharge, no skin change and no tenderness. Breasts are symmetrical.    Lymphadenopathy:    She has no cervical adenopathy.    She has axillary adenopathy.       Right axillary: Lateral  adenopathy present.       Right: No supraclavicular adenopathy present.       Left: No supraclavicular adenopathy present.  Neurological: She appears lethargic.    Data Reviewed NUCLEAR MEDICINE PET SKULL BASE TO THIGH  Fasting Blood Glucose: 228  Technique: 16.8 mCi F-18 FDG was injected intravenously. CT data  was obtained and used for attenuation correction and anatomic  localization only. (This was not acquired as a diagnostic CT  examination.) Additional exam technical data entered on  technologist worksheet.  Comparison: Thorax 05/22 1013  Findings:  Neck: Hypermetabolic metastasis within the cervical spine described  in skeletal section. Small hypermetabolic node in the posterior  right neck (level five) on image 40.  Chest: Multiple intensely hypermetabolic right axillary lymph  nodes. For example cluster of right axillary nodes(image 72)  measuring 12 mm short axis with SUV max = 8.0. In the posterior  right breast (image 91), there are two intensely hypermetabolic  masses measuring 24 mm and 15 mm. There is hypermetabolic  activity the subcutaneous tissue of the right breast which could  represent inflammatory carcinoma.  There is a hypermetabolic right internal mammary lymph node (image  71) with SUV max = 3.6. Review lung parenchyma demonstrates no  hypermetabolic pulmonary nodules. The ill-defined left upper lobe  nodule image 73 and right upper lobe nodule image 77.  Abdomen/Pelvis: Multiple intensely hypermetabolic hepatic lesions.  The lesions number approximately 30 and involve the left right  hepatic lobes. Exemplary lesion in the right hepatic lobe measures  2.8 cm with SUV max = 15.0 (image 99). All of the hepatic lesions  are hypermetabolic.  Hypermetabolic periportal lymph node with SUV max = 16.7 (image  119).  [place]: Widespread hypermetabolic skeletal metastasis involving  the proximal femurs, acetabulae, sacrum, lumbar, thoracic, and  cervical spine.  Additional lesions in the ribs, sternum, scapula,  and humeri. There is a lesion in the right skull base temporal  bone (image 12.  IMPRESSION:  1. Two hypermetabolic masses in the posterior right breast  consistent with primary breast carcinoma. Hypermetabolic  subcutaneous tissue on the right may represent inflammatory  carcinoma.  2. Local right axillary nodal metastasis.  3. Hypermetabolic internal mammary nodal metastasis on the right.  4. Hepatic metastasis with a large tumor burden within the left  and right hepatic lobe. These lesions are intensely  hypermetabolic.  5. Periportal nodal metastasis.  6. Widespread hypermetabolic skeletal metastasis involving the  axillary and appendicular skeleton including the right skull base.  Consider brain MRI.  7. Ill-defined pulmonary nodules without associated metabolic  activity.  DIGITAL DIAGNOSTIC BILATERAL MAMMOGRAM WITH CAD AND RIGHT BREAST  ULTRASOUND:  Comparison: None.  Findings: There is a fibroglandular pattern. No mass or malignant  type calcifications are seen in the left breast. An irregular mass  with spiculated margins is imaged in the upper-outer quadrant of  the right breast, posteriorly. Irregular, spiculated mass is  identified 1 cm anterior, also in the upper-outer quadrant. The  area measures 4.8 x 6.7 cm in conglomeration. Skin thickening  is  noted in the right breast.  Mammographic images were processed with CAD.  On physical exam, peau d'orange changes are noted in the skin in  the upper outer and upper central aspect of the right breast. I  palpate a firm, mobile mass in the 9 o'clock position which is  approximately 4 x 5 cm by palpation. No axillary mass is  palpated.  Sonographically, an irregular, hypoechoic mass is imaged with  associated acoustic shadowing in the 9 o'clock position, 11 cm the  nipple measuring 2.9 x 2.8 x 3.3 cm. An irregular, hypoechoic mass  with shadowing is also seen in the 9  o'clock position, 9 cm from  the nipple measuring 0.9 x 1.0 x 0.9 cm. Skin thickening is noted  sonographically. An abnormal node is imaged in the right axilla  with loss of the fatty hilum measuring 1.9 x 0.2 x 1.1 cm.  IMPRESSION:  Masses, right breast and right axilla for which biopsies are  recommended. No mammographic evidence of malignancy, left breast.   Assessment    Likely stage IV breast cancer    Plan    It certainly appears clinically and radiologically she has a stage IV breast cancer. She still is pending at tissue diagnosis at this point though prior to treatment. Apparently this is due to be done in the near future at [facility].  I discussed with her that there is not really a role for any surgery at this point except to treat her systemically and then follow along. At some point in particular cases of metastatic breast cancer there was a little for surgery that would be determined later down the road. I think for her disease that may have not been the case as she looks like she has a fair amount of visceral disease. I did discuss  a possible port placement associated with the risks of bleeding, infection, pneumothorax. I will get in touch with her medical oncologist and then we'll make a decision on how to proceed with all of this. She really needs a biopsy as soon as possible so she can begin getting treated and she has a variety of different complaints at all appear to be stemming from her metastatic cancer.       Vicki Sanders 02/05/2012, 11:03 AM

## 2012-02-06 ENCOUNTER — Telehealth: Payer: Self-pay | Admitting: Medical Oncology

## 2012-02-06 ENCOUNTER — Telehealth: Payer: Self-pay | Admitting: Oncology

## 2012-02-06 ENCOUNTER — Encounter: Payer: Self-pay | Admitting: Oncology

## 2012-02-06 ENCOUNTER — Other Ambulatory Visit: Payer: Self-pay | Admitting: Medical Oncology

## 2012-02-06 MED ORDER — OXYCODONE HCL 5 MG PO TABS: 15.0000 mg | ORAL_TABLET | Freq: Three times a day (TID) | ORAL | Status: DC | PRN

## 2012-02-06 NOTE — Telephone Encounter (Signed)
Patient called requesting more pain medication.  Script for Oxy IR 5mg  X 40tablets given on 5/31.  Patient states that she has enough to last her through today (6/4) but will not have any for tomorrow (6/5).  Reviewed situation with Colman Cater, FNP.  New prescription given for Oxy IR 5 mg X 20 tablets.

## 2012-02-06 NOTE — Progress Notes (Signed)
Patient called to let us know that she is not working anymore. I told patient that she would be okay that she had gotten approved for our financial assistance.

## 2012-02-06 NOTE — Telephone Encounter (Addendum)
Informed patient that her [facility] is [facility] and to bring a photo ID when she comes.  Also informed patient of appointment with Dr. Welton Flakes 02/23/2012 at 12 PM.  Patient expressed understanding.  Patient stated "I talked to [place] this morning and they approved me for 100% assistance and I was sent to [facility] for my biopsy and they can't do it until June 12 and Dr. Dwain Sarna said I need it sooner.  Can I get this appointment changed to somewhere at [facility] sooner?"  Spoke to [place] at the [facility] and was explained why patient was sent to [facility].  Conveyed this message to patient and she expressed understanding and that she is to keep this appointment at [facility].

## 2012-02-06 NOTE — Telephone Encounter (Signed)
Added the pt on the schedule on 02/23/2012 per [name] she will contact the pt regarding the appt with dr Welton Flakes.

## 2012-02-09 ENCOUNTER — Encounter: Payer: Self-pay | Admitting: Family

## 2012-02-09 ENCOUNTER — Other Ambulatory Visit: Payer: Self-pay | Admitting: Medical Oncology

## 2012-02-09 ENCOUNTER — Inpatient Hospital Stay (HOSPITAL_COMMUNITY): Admission: AD | Admit: 2012-02-09 | Payer: Self-pay | Source: Ambulatory Visit | Admitting: Oncology

## 2012-02-09 ENCOUNTER — Ambulatory Visit (HOSPITAL_BASED_OUTPATIENT_CLINIC_OR_DEPARTMENT_OTHER): Payer: Self-pay | Admitting: Family

## 2012-02-09 ENCOUNTER — Telehealth: Payer: Self-pay | Admitting: Medical Oncology

## 2012-02-09 VITALS — BP 135/84 | HR 105 | Temp 98.1°F | Ht 64.0 in | Wt 213.1 lb

## 2012-02-09 DIAGNOSIS — R079 Chest pain, unspecified: Secondary | ICD-10-CM

## 2012-02-09 DIAGNOSIS — R110 Nausea: Secondary | ICD-10-CM

## 2012-02-09 DIAGNOSIS — M549 Dorsalgia, unspecified: Secondary | ICD-10-CM | POA: Insufficient documentation

## 2012-02-09 DIAGNOSIS — M545 Low back pain: Secondary | ICD-10-CM

## 2012-02-09 DIAGNOSIS — K5900 Constipation, unspecified: Secondary | ICD-10-CM

## 2012-02-09 MED ORDER — OXYCODONE HCL 5 MG PO TABS: 15.0000 mg | ORAL_TABLET | ORAL | Status: DC | PRN

## 2012-02-09 NOTE — Telephone Encounter (Signed)
Spoke with patient notifying her of appointment with Harriett Sine today 6/7 @ 2 pm, also told patient that new prescription would be available for pick up at office visit.  Patient confirmed appointment time and date.  No further questions at this time

## 2012-02-09 NOTE — Telephone Encounter (Signed)
Received call from patient stating "I was suppose to call back yesterday to talk to Dr. Welton Flakes about my pain management and I forgot."  Returned call back to patient to gain more information. Patient states "  I had called earlier in the week to get some more pain medication and was given enough to last me until Thursday and then I was suppose to call back.  I really forgot to call back and I actually just felt too bad yesterday to call.  I hurt a lot and it is actually getting worse."  Inquired about patients pain and she stated "its all over", asked patient how often she was taking her pain medicine, if she was taking it every 8 hours as directed "no I try to spread it out as long as I can and some I am taking the medicine every 10-14 hours. I try to take the least amount I can get by with. I have had drug issues in the past and I don't anymore and I am just a little concerned that ya'll are thinking that I am taking too many. I am worried about addiction cause I know these can be addicting."  Educated patient on addiction and assured patient that we wanted to make sure that she has adequate pain management and that I would discuss the situation with Dr. Welton Flakes and that I would return a call to her today.  Patient expressed understanding and had no further questions.  Will review with MD

## 2012-02-09 NOTE — Telephone Encounter (Signed)
Attempted to contact patient in regards to medication refill and appointment scheduled for today.  No answer at number in demographics, no voicemail set up.  Will try again

## 2012-02-09 NOTE — Telephone Encounter (Signed)
Can you have the patient come in for a full evaluation and possible admission. Please put her on Vicki Sanders's schedule

## 2012-02-09 NOTE — Progress Notes (Signed)
[facility]  Name: Vicki Sanders                  DATE: 02/09/2012 MRN: [id number]                      DOB: 1966/01/22  REFERRING PHYSICIAN: Default, Provider, MD  DIAGNOSIS: Patient Active Problem List  Diagnoses Date Noted  . Back pain 02/09/2012     Encounter Diagnosis  Name Primary?  . Back pain Yes    CURRENT THERAPY: Pain medication as needed.   INTERIM HISTORY: Pt is seen urgently at the request of Dr. Welton Flakes. She is currently undergoing diagnostic procedures for what appears to be metastatic cancer to the spine and liver. Definitive biopsy is scheduled for next week.  Chief complaint is lower back and rib pain. Is taking Oxycodone IR 5 mg, 3 pills every 8 hours as needed for pain. She tells me she tries to go 10-11 hours between doses to make the medicine last longer. She shows me a bottle of 20 pills prescribed Wednesday which still has 3 pills left in the bottle. She assures me she is taking the medication herself for this significant back pain, and does get relief from it.   Constipation has been a problem for several days, she feels like it is contributing to the back pain. I encourage a fleets enema tonight followed by MOM nightly while on pain med. Also having some mild nausea, related to pain medication. Denies loss of bowel or bladder function or pain in the legs. No numbness. She is tearful when discussing the situation, admits to being scared. Explains that the pain is worse today because she has had several procedures (pelvic exam and pap smear) and has been very active. Expresses a desire to go home and rest. I offer hospitalization for pain control and she declines, insisting that she needs to be home with her 58 y/o daughter. I let her know there is a physician on call this weekend, if she changes her mind. I also remind her of the appt with Dr. Welton Flakes on 6/21 and let her know we may move that up if biopsy results are available sooner.   PHYSICAL EXAM: BP 135/84   Pulse 105  Temp(Src) 98.1 F (36.7 C) (Oral)  Ht 5\' 4"  (1.626 m)  Wt 213 lb 1.6 oz (96.662 kg)  BMI 36.58 kg/m2  LMP 01/25/2012 General: Well developed, well nourished, in no acute distress.  EENT: No ocular or oral lesions. No stomatitis.  Respiratory: Lungs are clear to auscultation bilaterally with normal respiratory movement and no accessory muscle use. Cardiac: No murmur, rub or tachycardia. No upper or lower extremity edema.  GI: Abdomen is soft, no palpable hepatosplenomegaly. No fluid wave. No tenderness. Musculoskeletal: No kyphosis, has tenderness with percussion over the spine. Lymph: No cervical, infraclavicular, axillary or inguinal adenopathy. Neuro: No focal neurological deficits. Psych: Alert and oriented X 3, appropriate mood and affect. Tearful at times during the appt.   IMPRESSION:  46 y/o with:  1. Abnormal imaging, likely malignancy, undergoing definitive biopsy next week.  2. Significant back pain, relieved with scheduled pain medication. MOM  3. She declines hospitalization for pain control.   PLAN:   1. Proceed with biopsy next week.  2. Return to see Dr. Welton Flakes 6/21, possibly sooner if biopsy results become available.  3. Fleets enema tonight.  4. MOM nightly. 5. Prescription for Oxycodone IR 5 mg, 3 tabs every 4 hrs  as needed for pain is generated by Dr. Welton Flakes and given to patient.    DISCUSSION: This information is relayed to Dr. Welton Flakes.

## 2012-02-15 ENCOUNTER — Other Ambulatory Visit: Payer: Self-pay | Admitting: Medical Oncology

## 2012-02-15 ENCOUNTER — Telehealth: Payer: Self-pay | Admitting: *Deleted

## 2012-02-15 MED ORDER — OXYCODONE HCL 5 MG PO TABS: 15.0000 mg | ORAL_TABLET | ORAL | Status: AC | PRN

## 2012-02-15 NOTE — Telephone Encounter (Signed)
  Pt called [facility] for pain medication refill. Last refill 6/7 -Oxycodone 5mg  IR tablet Q#90.Take 3 tablets ( 15mg ) q 4hrs. Pain. No refills at this time.  Next MD appt 6/21 Will review with MD.

## 2012-02-15 NOTE — Telephone Encounter (Signed)
Telephoned patient at home #. Spoke with patient about filling paperwork out for [facility]. Patient states filled out medicaid paperwork at [facility] 2 weeks ago and is was waiting on approval. Will meet with on Fri June 21 after appointment.

## 2012-02-15 NOTE — Telephone Encounter (Signed)
Ok to refill 

## 2012-02-16 ENCOUNTER — Telehealth: Payer: Self-pay | Admitting: *Deleted

## 2012-02-16 NOTE — Telephone Encounter (Signed)
per orders from 02-16-2012 patient is aware of appointment for 02-20-2012 at 10:00am with dr.khan

## 2012-02-16 NOTE — Telephone Encounter (Signed)
Per MD,Called notified pt MD Appt for 6/21 has changed to 6/18 at 10am. Pt verbalized she has enough pain medication at this time. Pt advised Biopsy done at [facility] [place] [phone] will have records. Pt verbalized and confirmed appt 6/18 10am. Call to [facility], pt's results received via fax. Copy to medical records   Onc Tx sent for appt date change

## 2012-02-20 ENCOUNTER — Ambulatory Visit (HOSPITAL_BASED_OUTPATIENT_CLINIC_OR_DEPARTMENT_OTHER): Payer: Medicaid Other | Admitting: Lab

## 2012-02-20 ENCOUNTER — Ambulatory Visit (HOSPITAL_BASED_OUTPATIENT_CLINIC_OR_DEPARTMENT_OTHER): Payer: Medicaid Other | Admitting: Oncology

## 2012-02-20 ENCOUNTER — Telehealth: Payer: Self-pay | Admitting: *Deleted

## 2012-02-20 ENCOUNTER — Encounter: Payer: Self-pay | Admitting: Oncology

## 2012-02-20 VITALS — BP 133/86 | HR 118 | Temp 98.8°F | Ht 64.0 in | Wt 211.6 lb

## 2012-02-20 DIAGNOSIS — C50919 Malignant neoplasm of unspecified site of unspecified female breast: Secondary | ICD-10-CM

## 2012-02-20 DIAGNOSIS — C787 Secondary malignant neoplasm of liver and intrahepatic bile duct: Secondary | ICD-10-CM

## 2012-02-20 DIAGNOSIS — C7951 Secondary malignant neoplasm of bone: Secondary | ICD-10-CM

## 2012-02-20 DIAGNOSIS — C801 Malignant (primary) neoplasm, unspecified: Secondary | ICD-10-CM

## 2012-02-20 DIAGNOSIS — C7952 Secondary malignant neoplasm of bone marrow: Secondary | ICD-10-CM

## 2012-02-20 LAB — CBC WITH DIFFERENTIAL/PLATELET
Basophils Absolute: 0.1 10*3/uL (ref 0.0–0.1)
Eosinophils Absolute: 0.1 10*3/uL (ref 0.0–0.5)
HCT: 39.2 % (ref 34.8–46.6)
HGB: 12.9 g/dL (ref 11.6–15.9)
MCH: 29.2 pg (ref 25.1–34.0)
NEUT#: 4.8 10*3/uL (ref 1.5–6.5)
NEUT%: 74 % (ref 38.4–76.8)
RDW: 14.1 % (ref 11.2–14.5)
lymph#: 1 10*3/uL (ref 0.9–3.3)

## 2012-02-20 LAB — COMPREHENSIVE METABOLIC PANEL
Albumin: 2.9 g/dL — ABNORMAL LOW (ref 3.5–5.2)
BUN: 10 mg/dL (ref 6–23)
CO2: 26 mEq/L (ref 19–32)
Calcium: 9.2 mg/dL (ref 8.4–10.5)
Chloride: 98 mEq/L (ref 96–112)
Creatinine, Ser: 0.63 mg/dL (ref 0.50–1.10)
Glucose, Bld: 316 mg/dL — ABNORMAL HIGH (ref 70–99)
Potassium: 4.3 mEq/L (ref 3.5–5.3)

## 2012-02-20 MED ORDER — PROCHLORPERAZINE MALEATE 10 MG PO TABS: 10.0000 mg | ORAL_TABLET | Freq: Four times a day (QID) | ORAL | Status: AC | PRN

## 2012-02-20 MED ORDER — PROCHLORPERAZINE 25 MG RE SUPP: 25.0000 mg | Freq: Two times a day (BID) | RECTAL | Status: AC | PRN

## 2012-02-20 MED ORDER — ONDANSETRON HCL 8 MG PO TABS: ORAL_TABLET | ORAL | Status: AC

## 2012-02-20 MED ORDER — FENTANYL 50 MCG/HR TD PT72: 1.0000 | MEDICATED_PATCH | TRANSDERMAL | Status: DC

## 2012-02-20 MED ORDER — LORAZEPAM 0.5 MG PO TABS: 0.5000 mg | ORAL_TABLET | Freq: Four times a day (QID) | ORAL | Status: DC | PRN

## 2012-02-20 MED ORDER — LIDOCAINE-PRILOCAINE 2.5-2.5 % EX CREA: TOPICAL_CREAM | CUTANEOUS | Status: AC | PRN

## 2012-02-20 MED ORDER — DEXAMETHASONE 4 MG PO TABS: ORAL_TABLET | ORAL | Status: AC

## 2012-02-20 NOTE — Progress Notes (Signed)
OFFICE PROGRESS NOTE  CC Dr. Emelia Loron  DIAGNOSIS: 46 year old female with New diagnosis of widely metastatic Breast cancer diagnosed 02/14/2012 by a needle core biopsy of the right breast. Her needle core biopsy was performed at [facility] and the pathology was reviewed by Dr. Delena Bali. Her was ER-positive 5% PR negative HER-2/neu overexpressed Ki-67 50%  PRIOR THERAPY:  #1 patient originally presented to my clinic on 01/23/2012 with a right breast mass and flank pain. At that time I ordered staging studies and ordered mammogram as well as an ultrasound.The ultrasound showed a irregular hypoechoic mass in the 9:00 position 11 cm from the nipple measuring 2.9 x 2.8 x 3.3 cm. Another irregular hypo-: Mass was noted in the 9:00 position 9 cm from the nipple measuring 0.9 x 1.0 x 0.9 cm skin thickening was noted.And abnormal node was imaged in the right axilla with loss of fatty hilum measuring 1.9 x 0.2 x 1.1 cm. Left breast was negative for a malignancy. Performed on the same day revealed in the right breast posteriorly in the upper outer quadrant irregular spiculated mass 1 cm anterior in the upper outer quadrant area measured 4.8 x 6.7 cm in conglomeration with skin thickening.  #2 PET CT revealed 2 hypermetabolic masses in the posterior right breast consistent with primary breast carcinoma hypermetabolic subcutaneous tissue on the right possibly representing inflammatory carcinoma. There was a local right axillary nodal metastasis. Also noted were hypermetabolic internal mammary nodal metastasis on the right. Hepatic metastases with large tumor burden within the left and right hepatic lobe the lesions were intensely hypermetabolic with SUV 15.0 there also noted to be periportal nodal metastases. Widespread hypermetabolic skeletal metastases involving the axillary and appendicular skeleton including the skull base. There are ill-defined pulmonary nodules without associated metabolic  activity.Vicki Sanders #3 patient had a right breast biopsy performed on 02/14/2012. 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 The prognostic panel reveals estrogen receptor 5% moderately staining progesterone receptor 0% HER-2/neu strong +3 and overexpressed proliferation marker Ki-67 50% and elevated.  3 patient has been seen by Dr. Emelia Loron in consultation  CURRENT THERAPY:. Patient will begin Taxotere carboplatinum and Herceptin once she has her Port-A-Cath placed next week.  INTERVAL HISTORY: Vicki Sanders 46 y.o. female returns for Up visit today after having had her biopsy performed. Clinically patient seems to be very fatigued and tired. She has significant ongoing chronic pain. She has required pain medications from [place] quite often. She also looks a little jaundiced. She denies having any nausea or vomiting currently she does have some abdominal pain she denies any peripheral paresthesias she has not noticed any bleeding she has no constipation or diarrhea. Her appetite is poor but she is continuing to eat some. She does continue to have some back pain. She is denying any headaches difficulty in ambulation or any kind of seizure-like activity.  MEDICAL HISTORY: Past Medical History  Diagnosis Date  . Diabetes mellitus   . Arthritis   . Cancer 01/23/2012    ALLERGIES:  is allergic to cephalosporins.  MEDICATIONS:  Current Outpatient Prescriptions  Medication Sig Dispense Refill  . oxyCODONE (OXY IR/ROXICODONE) 5 MG immediate release tablet Take 3 tablets (15 mg total) by mouth every 4 (four) hours as needed for pain.  90 tablet  0    SURGICAL HISTORY:  Past Surgical History  Procedure Date  . Shoulder arthrotomy   .  Tonsillectomy   . Appendectomy   . Cesarean section     REVIEW OF SYSTEMS:  Pertinent items are noted in HPI.   PHYSICAL EXAMINATION: General appearance: alert, cooperative, appears older than stated age, fatigued, flushed, mild distress, mildly obese and pale Neck: no adenopathy, no carotid bruit, no JVD, supple, symmetrical, trachea midline and thyroid not enlarged, symmetric, no tenderness/mass/nodules Lymph nodes: Cervical, supraclavicular, and axillary nodes normal. Resp: clear to auscultation bilaterally and normal percussion bilaterally Back: he does have some tenderness diffusely Cardio: regular rate and rhythm, S1, S2 normal, no murmur, click, rub or gallop GI: abdomen is soft liver is palpable 2 fingerbreadths below the right costal margin no splenomegaly no other palpable masses bowel sounds are present Extremities: extremities normal, atraumatic, no cyanosis or edema Neurologic: Grossly normal Bilateral breast examination right breast reveals a palpable mass measuring about 5 cm it is tender to palpation no nipple discharge there are some thickening of the skin and skin changes. Left breast no masses nipple discharge or skin changes. ECOG PERFORMANCE STATUS: 1 - Symptomatic but completely ambulatory  Blood pressure 133/86, pulse 118, temperature 98.8 F (37.1 C), temperature source Oral, height 5\' 4"  (1.626 m), weight 211 lb 9.6 oz (95.981 kg), last menstrual period 01/25/2012.  LABORATORY DATA: Lab Results  Component Value Date   WBC 6.6 01/23/2012   HGB 14.0 01/23/2012   HCT 42.2 01/23/2012   MCV 88.6 01/23/2012   PLT 226 01/23/2012      Chemistry      Component Value Date/Time   NA 134* 01/23/2012 1536   K 4.1 01/23/2012 1536   CL 99 01/23/2012 1536   CO2 24 01/23/2012 1536   BUN 12 01/23/2012 1536   CREATININE 0.61 01/23/2012 1536      Component Value Date/Time   CALCIUM 9.3 01/23/2012 1536   ALKPHOS 257* 01/23/2012 1536   AST 59* 01/23/2012 1536   ALT 40* 01/23/2012  1536   BILITOT 0.5 01/23/2012 1536       RADIOGRAPHIC STUDIES:  Ct Abdomen Pelvis Wo Contrast  01/22/2012  *RADIOLOGY REPORT*  Clinical Data: Right flank pain, rule out kidney stone  CT ABDOMEN AND PELVIS WITHOUT CONTRAST  Technique:  Multidetector CT imaging of the abdomen and pelvis was performed following the standard protocol without intravenous contrast.  Comparison: Pain  Findings: The lung bases are unremarkable.  Sagittal images of the spine shows mild degenerative changes.  There is a lytic lesion in the L1 vertebral body measures 8 mm.  Lytic lesion in the L2 vertebral body measures 10 mm.  The small lytic lesion in the S3 vertebral body measures 5 mm.  Lytic lesion in the L4 vertebral body measures 7 mm.  Clinical correlation is necessary to exclude metastatic disease.  Multiple low density lesions are noted throughout the liver is suspicious for metastatic disease.  No calcified gallstones are noted within gallbladder.  Kidneys are symmetrical in size.  No nephrolithiasis.  No hydronephrosis or hydroureter.  No calcified ureteral calculi are noted. The pancreas, spleen and adrenal glands are unremarkable.  No small bowel obstruction.  No ascites or free air.  No adenopathy.  There is no pericecal inflammation.  The unenhanced uterus and adnexa are unremarkable.  No colonic obstruction.  The urinary bladder is under distended grossly unremarkable. Question lytic lesion right sacrum measures 1.7 cm.  IMPRESSION:  1.Multiple low density lesions within liver highly suspicious for metastatic disease.  Correlation with enhanced CT or MRI is recommended. 2. There are lytic lesions multiple vertebral body highly suspicious for metastatic disease.  Correlation with bone scan is recommended. Question lytic lesion right sacrum. 3.  No nephrolithiasis.  No hydronephrosis or hydroureter. 4.  No pericecal inflammation.  I discussed immediately with Dr. Preston Fleeting  Original Report Authenticated By: Natasha Mead, M.D.     Ct Chest W Contrast  01/22/2012  *RADIOLOGY REPORT*  Clinical Data:  Liver lesions  CT CHEST, ABDOMEN AND PELVIS WITH CONTRAST  Technique:  Multidetector CT imaging of the chest, abdomen and pelvis was performed following the standard protocol during bolus administration of intravenous contrast.  Contrast: 80mL OMNIPAQUE IOHEXOL 300 MG/ML  SOLN, 1 OMNIPAQUE IOHEXOL 300 MG/ML  SOLN  Comparison:   None.  CT CHEST  Findings:  2.8 cm mass in the right breast with spiculated margins. Abnormal right axillary adenopathy.  14 mm right axillary node on image 11.  14 mm node in the right axilla on image #4.  Other smaller adjacent nodes are evident.  Negative abnormal mediastinal adenopathy.  No pleural effusion.  No pericardial effusion.  Mild diffuse esophageal wall thickening.  Irregular 11 mm irregular pulmonary parenchymal density with spiculations in the right middle lobe adjacent to the minor fissure.  Lytic lesion in the upper sternum on image 16.  Several lytic lesions throughout the thoracic vertebral bodies.  No evidence of pathologic fracture.  No obvious epidural spread of tumor.  IMPRESSION: Right breast mass and associated right axillary adenopathy worrisome for breast carcinoma.  Multiple lytic bone lesions.  Irregular spiculated pulmonary parenchymal density in the right middle lobe.  This may also represent carcinoma.  CT ABDOMEN AND PELVIS  Findings:  Healing lesions are seen throughout the liver worrisome for metastatic disease.  Contour of the liver is nodular.  This is a nonspecific finding.  Enhancement of the spleen is somewhat heterogeneous.  Kidney is adrenal glands, pancreas are within normal limits.  No free fluid. Small para-aortic nodes are present.  1.6 cm short axis diameter portacaval node.  Other smaller celiac nodes are present.  Innumerable lytic bone lesions throughout the thoracolumbar spine. Lytic lesion in the left ischium on image 114.  Uterus is enlarged.  Bladder and adnexa are  within normal limits.  IMPRESSION: Multiple liver lesions worrisome for metastatic disease.  Enlarged retroperitoneal nodes in the upper abdomen.  Multiple lytic bone lesions worrisome for metastatic disease.  Further workup in this patient can be achieved with mammography and PET CT.  Original Report Authenticated By: Donavan Burnet, M.D.   US Breast Right  02/02/2012  *RADIOLOGY REPORT*  Clinical Data:  31-month history of right breast mass with intermittent pain.  DIGITAL DIAGNOSTIC BILATERAL MAMMOGRAM WITH CAD AND RIGHT BREAST ULTRASOUND:  Comparison:  None.  Findings:  There is a fibroglandular pattern.  No mass or malignant type calcifications are seen in the left breast. An irregular mass with spiculated margins is imaged in the upper-outer quadrant of the right breast, posteriorly.  Irregular, spiculated mass is identified 1 cm anterior, also in the upper-outer quadrant.  The area measures 4.8 x 6.7 cm in conglomeration.  Skin thickening is noted in the right breast. Mammographic images were processed with CAD.  On physical exam, peau d'orange changes are noted in the skin in the upper outer and upper central aspect of the right breast.  I palpate a firm, mobile mass  in the 9 o'clock position which is approximately 4 x 5 cm by palpation.   No axillary mass is palpated.  Sonographically, an irregular, hypoechoic mass is imaged with associated acoustic shadowing in the 9 o'clock position, 11 cm the nipple measuring 2.9 x 2.8 x 3.3 cm. An irregular, hypoechoic mass with shadowing is also seen in the 9 o'clock position, 9 cm from the nipple measuring 0.9 x 1.0 x 0.9 cm.  Skin thickening is noted sonographically.  An abnormal node is imaged in the right axilla with loss of the fatty hilum measuring 1.9 x 0.2 x 1.1 cm.  IMPRESSION: Masses, right breast and right axilla for which biopsies are recommended.  No mammographic evidence of malignancy, left breast.  BI-RADS CATEGORY 5:  Highly suggestive of malignancy -  appropriate action should be taken.  Original Report Authenticated By: Hiram Gash, M.D.   Ct Abdomen Pelvis W Contrast  01/22/2012  *RADIOLOGY REPORT*  Clinical Data:  Liver lesions  CT CHEST, ABDOMEN AND PELVIS WITH CONTRAST  Technique:  Multidetector CT imaging of the chest, abdomen and pelvis was performed following the standard protocol during bolus administration of intravenous contrast.  Contrast: 80mL OMNIPAQUE IOHEXOL 300 MG/ML  SOLN, 1 OMNIPAQUE IOHEXOL 300 MG/ML  SOLN  Comparison:   None.  CT CHEST  Findings:  2.8 cm mass in the right breast with spiculated margins. Abnormal right axillary adenopathy.  14 mm right axillary node on image 11.  14 mm node in the right axilla on image #4.  Other smaller adjacent nodes are evident.  Negative abnormal mediastinal adenopathy.  No pleural effusion.  No pericardial effusion.  Mild diffuse esophageal wall thickening.  Irregular 11 mm irregular pulmonary parenchymal density with spiculations in the right middle lobe adjacent to the minor fissure.  Lytic lesion in the upper sternum on image 16.  Several lytic lesions throughout the thoracic vertebral bodies.  No evidence of pathologic fracture.  No obvious epidural spread of tumor.  IMPRESSION: Right breast mass and associated right axillary adenopathy worrisome for breast carcinoma.  Multiple lytic bone lesions.  Irregular spiculated pulmonary parenchymal density in the right middle lobe.  This may also represent carcinoma.  CT ABDOMEN AND PELVIS  Findings:  Healing lesions are seen throughout the liver worrisome for metastatic disease.  Contour of the liver is nodular.  This is a nonspecific finding.  Enhancement of the spleen is somewhat heterogeneous.  Kidney is adrenal glands, pancreas are within normal limits.  No free fluid. Small para-aortic nodes are present.  1.6 cm short axis diameter portacaval node.  Other smaller celiac nodes are present.  Innumerable lytic bone lesions throughout the  thoracolumbar spine. Lytic lesion in the left ischium on image 114.  Uterus is enlarged.  Bladder and adnexa are within normal limits.  IMPRESSION: Multiple liver lesions worrisome for metastatic disease.  Enlarged retroperitoneal nodes in the upper abdomen.  Multiple lytic bone lesions worrisome for metastatic disease.  Further workup in this patient can be achieved with mammography and PET CT.  Original Report Authenticated By: Donavan Burnet, M.D.   Nm Pet Image Initial (pi) Skull Base To Thigh  01/31/2012  *RADIOLOGY REPORT*  Clinical Data: Initial treatment strategy for breast cancer.  NUCLEAR MEDICINE PET SKULL BASE TO THIGH  Fasting Blood Glucose:  228  Technique:  16.8 mCi F-18 FDG was injected intravenously. CT data was obtained and used for attenuation correction and anatomic localization only.  (This was not acquired as a diagnostic CT  examination.) Additional exam technical data entered on technologist worksheet.  Comparison:  [place] 05/22 1013  Findings:  Neck: Hypermetabolic metastasis within the cervical spine described in skeletal section.   Small hypermetabolic node in the posterior right neck (level five) on image 40.  Chest:  Multiple intensely hypermetabolic right axillary lymph nodes.  For example cluster of right axillary nodes(image 72) measuring 12 mm short axis with  SUV max = 8.0.  In the posterior right breast (image 91), there are two intensely hypermetabolic masses measuring 24 mm and  15 mm.  There is hypermetabolic activity the subcutaneous tissue of the right breast which could represent inflammatory carcinoma.  There is a hypermetabolic right internal mammary lymph node (image 71) with SUV max = 3.6.  Review lung parenchyma demonstrates no hypermetabolic pulmonary nodules.  The ill-defined left upper lobe nodule image 73 and right upper lobe nodule image 77.  Abdomen/Pelvis:  Multiple intensely hypermetabolic hepatic lesions. The lesions number approximately 30 and involve the  left right hepatic lobes.  Exemplary lesion in the right hepatic lobe measures 2.8 cm with SUV max = 15.0 (image 99).  All of the hepatic lesions are hypermetabolic.  Hypermetabolic periportal lymph node with SUV max = 16.7 (image 119).  [place]:  Widespread hypermetabolic skeletal metastasis involving the proximal femurs, acetabulae, sacrum, lumbar, thoracic, and cervical spine.  Additional lesions in the ribs, sternum,  scapula, and humeri.  There is a lesion in the right skull base  temporal bone (image 12.  IMPRESSION:  1.  Two hypermetabolic masses in the posterior right breast consistent with primary breast carcinoma.  Hypermetabolic subcutaneous tissue on the right may represent inflammatory carcinoma. 2.  Local right axillary nodal metastasis. 3.  Hypermetabolic internal mammary nodal metastasis on  the right. 4.  Hepatic metastasis with a large tumor burden within the left and right hepatic lobe.  These lesions are intensely hypermetabolic. 5.  Periportal nodal metastasis. 6.  Widespread hypermetabolic skeletal metastasis involving the axillary and appendicular skeleton including the right skull base. Consider brain MRI.  7.  Ill-defined pulmonary nodules without associated metabolic activity.  Original Report Authenticated By: Genevive Bi, M.D.   Mm Digital Diagnostic Bilat  02/02/2012  *RADIOLOGY REPORT*  Clinical Data:  56-month history of right breast mass with intermittent pain.  DIGITAL DIAGNOSTIC BILATERAL MAMMOGRAM WITH CAD AND RIGHT BREAST ULTRASOUND:  Comparison:  None.  Findings:  There is a fibroglandular pattern.  No mass or malignant type calcifications are seen in the left breast. An irregular mass with spiculated margins is imaged in the upper-outer quadrant of the right breast, posteriorly.  Irregular, spiculated mass is identified 1 cm anterior, also in the upper-outer quadrant.  The area measures 4.8 x 6.7 cm in conglomeration.  Skin thickening is noted in the right breast.  Mammographic images were processed with CAD.  On physical exam, peau d'orange changes are noted in the skin in the upper outer and upper central aspect of the right breast.  I palpate a firm, mobile mass in the 9 o'clock position which is approximately 4 x 5 cm by palpation.   No axillary mass is palpated.  Sonographically, an irregular, hypoechoic mass is imaged with associated acoustic shadowing in the 9 o'clock position, 11 cm the nipple measuring 2.9 x 2.8 x 3.3 cm. An irregular, hypoechoic mass with shadowing is also seen in the 9 o'clock position, 9 cm from the nipple measuring 0.9 x 1.0 x 0.9 cm.  Skin thickening is noted sonographically.  An  abnormal node is imaged in the right axilla with loss of the fatty hilum measuring 1.9 x 0.2 x 1.1 cm.  IMPRESSION: Masses, right breast and right axilla for which biopsies are recommended.  No mammographic evidence of malignancy, left breast.  BI-RADS CATEGORY 5:  Highly suggestive of malignancy - appropriate action should be taken.  Original Report Authenticated By: Hiram Gash, M.D.    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 Patient is status post needle core biopsy of the right breast mass. The final pathology revealed a invasive ductal carcinoma high-grade with LDI tumor ER +5% PR -0% HER-2/neu overexpressed 3+ Ki-67 50%.  #2 patient's staging studies reveal widely metastatic disease including liver and bone metastases and nodal metastasis.  #3 recommendation is Herceptin based systemic chemotherapy. I have recommended Taxotere carboplatinum and Herceptin. The Taxotere carboplatinum will be given every 3 weeks with Herceptin given weekly. The Taxotere carboplatinum will be given for a total of 6 cycles. She will receive day 2 Neulasta.  #4 after patient has had at least 3-4 cycles  of chemotherapy we will do restaging studies including a PET/CT's.  #5 patient will eventually require ongoing therapy with Herceptin and antiestrogen likely consisting of tamoxifen since patient is premenopausal.  #6 for bony metastasis we will start XGeva every 28 days.   PLAN:   #1Patient will need a Port-A-Cath placed and I will ask interventional radiology to have this done.  #2 echocardiogram and chemotherapy class. Patient and I discussed extensively the types of chemotherapy she will be receiving and the rationale for the echocardiogram.  #3 I will also refer the patient to cardiology for evaluation and management throughout her Herceptin therapy. She will be referred to the CHF clinic.   #4 We will plan on treating her on 02/27/2012 with her first cycle of treatment.  #5 I have also contacted the receipt search department to see if she would be a candidate for a cancer control study [employer] [facility] [phone]   All questions were answered. The patient knows to call the clinic with any problems, questions or concerns. We can certainly see the patient much sooner if necessary.  I spent 40 minutes counseling the patient face to face. The total time spent in the appointment was 40 minutes.    Drue Second, MD Medical/Oncology [facility] [phone] (beeper) [phone] (Office)  02/20/2012, 10:20 AM

## 2012-02-20 NOTE — Patient Instructions (Signed)
1. We discussed the pathology and your treatment for the breast cancer.  2. You will need port a cath  3. You chemotherapy drug information sheets given to you. Your chemotherapy regimen is Taxotere/Carboplatin/Herceptin  3. Pain: fentanyl patches and percocet.  4. Refer to Dr. Gala Romney for cardiac evaluation  5. Echocardiogram  6. Chemo class

## 2012-02-20 NOTE — Telephone Encounter (Signed)
Made patient appointment for 02-27-2012 03-19-2012 04-09-2012 04-30-2012 05-21-2012 06-11-2012 emailed michelle to set up patient's treatment for the dates included in this message will give patient her calendar on 02-21-2012 in the chemo class

## 2012-02-21 ENCOUNTER — Other Ambulatory Visit: Payer: Self-pay

## 2012-02-21 ENCOUNTER — Telehealth: Payer: Self-pay | Admitting: *Deleted

## 2012-02-21 ENCOUNTER — Encounter: Payer: Self-pay | Admitting: *Deleted

## 2012-02-21 ENCOUNTER — Other Ambulatory Visit: Payer: Self-pay | Admitting: Radiology

## 2012-02-21 NOTE — Telephone Encounter (Signed)
Per staff message and POF I have scheduled appts. No available for first time treatment. Gave back to scheduler. JMW

## 2012-02-21 NOTE — Telephone Encounter (Signed)
Per conversation with Dr. Welton Flakes I have moved teh 1st treetment to 6/27. JMW

## 2012-02-22 ENCOUNTER — Encounter (HOSPITAL_COMMUNITY): Payer: Self-pay | Admitting: Pharmacy Technician

## 2012-02-22 ENCOUNTER — Ambulatory Visit (HOSPITAL_COMMUNITY)
Admission: RE | Admit: 2012-02-22 | Discharge: 2012-02-22 | Disposition: A | Discharge status: Home / Self Care | Payer: Medicaid Other | Source: Ambulatory Visit | Attending: Oncology | Admitting: Oncology

## 2012-02-22 DIAGNOSIS — I517 Cardiomegaly: Secondary | ICD-10-CM | POA: Insufficient documentation

## 2012-02-22 DIAGNOSIS — F17200 Nicotine dependence, unspecified, uncomplicated: Secondary | ICD-10-CM | POA: Insufficient documentation

## 2012-02-22 DIAGNOSIS — E119 Type 2 diabetes mellitus without complications: Secondary | ICD-10-CM | POA: Insufficient documentation

## 2012-02-22 DIAGNOSIS — C50919 Malignant neoplasm of unspecified site of unspecified female breast: Secondary | ICD-10-CM

## 2012-02-22 NOTE — Progress Notes (Signed)
*  PRELIMINARY RESULTS* Echocardiogram 2D Echocardiogram has been performed.  Vicki Sanders 02/22/2012, 12:02 PM

## 2012-02-23 ENCOUNTER — Ambulatory Visit (HOSPITAL_COMMUNITY)
Admission: RE | Admit: 2012-02-23 | Discharge: 2012-02-23 | Disposition: A | Discharge status: Home / Self Care | Payer: Medicaid Other | Source: Ambulatory Visit | Attending: Oncology | Admitting: Oncology

## 2012-02-23 ENCOUNTER — Encounter: Payer: Self-pay | Admitting: *Deleted

## 2012-02-23 ENCOUNTER — Encounter (HOSPITAL_COMMUNITY): Payer: Self-pay

## 2012-02-23 ENCOUNTER — Ambulatory Visit (HOSPITAL_COMMUNITY)
Admission: RE | Admit: 2012-02-23 | Discharge: 2012-02-23 | Disposition: A | Discharge status: Home / Self Care | Payer: Medicaid Other | Source: Ambulatory Visit | Attending: Diagnostic Radiology | Admitting: Diagnostic Radiology

## 2012-02-23 ENCOUNTER — Ambulatory Visit: Payer: Self-pay | Admitting: Oncology

## 2012-02-23 ENCOUNTER — Other Ambulatory Visit: Payer: Self-pay | Admitting: Radiology

## 2012-02-23 DIAGNOSIS — Z87891 Personal history of nicotine dependence: Secondary | ICD-10-CM | POA: Insufficient documentation

## 2012-02-23 DIAGNOSIS — E119 Type 2 diabetes mellitus without complications: Secondary | ICD-10-CM | POA: Insufficient documentation

## 2012-02-23 DIAGNOSIS — C7951 Secondary malignant neoplasm of bone: Secondary | ICD-10-CM

## 2012-02-23 DIAGNOSIS — C50919 Malignant neoplasm of unspecified site of unspecified female breast: Secondary | ICD-10-CM | POA: Insufficient documentation

## 2012-02-23 LAB — CBC
Hemoglobin: 12.8 g/dL (ref 12.0–15.0)
MCH: 29.5 pg (ref 26.0–34.0)
Platelets: 218 10*3/uL (ref 150–400)
RBC: 4.34 MIL/uL (ref 3.87–5.11)
WBC: 7.6 10*3/uL (ref 4.0–10.5)

## 2012-02-23 LAB — PROTIME-INR
INR: 1.21 (ref 0.00–1.49)
Prothrombin Time: 15.6 seconds — ABNORMAL HIGH (ref 11.6–15.2)

## 2012-02-23 LAB — APTT: aPTT: 28 seconds (ref 24–37)

## 2012-02-23 MED ORDER — VANCOMYCIN HCL IN DEXTROSE 1-5 GM/200ML-% IV SOLN: 1000.0000 mg | Freq: Once | INTRAVENOUS | Status: DC

## 2012-02-23 MED ORDER — DIPHENHYDRAMINE HCL 50 MG/ML IJ SOLN: 25.0000 mg | Freq: Once | INTRAMUSCULAR | Status: DC

## 2012-02-23 MED ORDER — FENTANYL CITRATE 0.05 MG/ML IJ SOLN
INTRAMUSCULAR | Status: AC
  Filled 2012-02-23: qty 4

## 2012-02-23 MED ORDER — MIDAZOLAM HCL 2 MG/2ML IJ SOLN
INTRAMUSCULAR | Status: AC
  Filled 2012-02-23: qty 6

## 2012-02-23 MED ORDER — CEFAZOLIN SODIUM 1-5 GM-% IV SOLN
INTRAVENOUS | Status: AC
  Filled 2012-02-23: qty 50

## 2012-02-23 MED ORDER — DIPHENHYDRAMINE HCL 50 MG/ML IJ SOLN
INTRAMUSCULAR | Status: AC
  Filled 2012-02-23: qty 1

## 2012-02-23 MED ORDER — SODIUM CHLORIDE 0.9 % IV SOLN: Freq: Once | INTRAVENOUS | Status: DC

## 2012-02-23 MED ORDER — LIDOCAINE HCL 1 % IJ SOLN
INTRAMUSCULAR | Status: AC
  Filled 2012-02-23: qty 20

## 2012-02-23 MED ORDER — VANCOMYCIN HCL IN DEXTROSE 1-5 GM/200ML-% IV SOLN
INTRAVENOUS | Status: AC
  Filled 2012-02-23: qty 200

## 2012-02-23 NOTE — H&P (Signed)
Chief Complaint: Stage IV breast cancer Referring Physician:Khan HPI: Vicki Sanders is an 46 y.o. female with stage IV breast CA who is referred for port placement to begin chemotherapy.  Past Medical History:  Past Medical History  Diagnosis Date  . Diabetes mellitus   . Arthritis   . Cancer 01/23/2012    Past Surgical History:  Past Surgical History  Procedure Date  . Shoulder arthrotomy   . Tonsillectomy   . Appendectomy   . Cesarean section     Family History:  Family History  Problem Relation Age of Onset  . Cancer Mother 33    lung cancer  . Diabetes Father   . Diabetes Sister   . Cancer Maternal Aunt     bilateral cancers, mastectomy  . Cancer Paternal Grandmother     bone cancer    Social History:  reports that she quit smoking about 4 weeks ago. She has never used smokeless tobacco. She reports that she does not drink alcohol or use illicit drugs.  Allergies:  Allergies  Allergen Reactions  . Cephalosporins Hives and Itching    Medications: dexamethasone (DECADRON) 4 MG tablet 30 tablet 1 02/20/2012 02/19/2013 Sig: Take 2 tablets two times a day the day before Taxotere. Then take 2 tabs two times a day starting the day after chemo for 3 days. Non-formulary Exception Code: Dosage strength/form Number of times this order has been changed since signing: 1 Order [address] fentaNYL (DURAGESIC - DOSED MCG/HR) 50 MCG/HR 10 patch 0 02/20/2012 03/21/2012 Sig - Route: Place 1 patch (50 mcg total) onto the skin every 3 (three) days. - Transdermal Class: Print Number of times this order has been changed since signing: 1 Order [address] lidocaine-prilocaine (EMLA) cream 30 g 6 02/20/2012 02/19/2013 Sig - Route: Apply topically as needed. - Topical Number of times this order has been changed since signing: 1 Order [address] Trail LORazepam (ATIVAN) 0.5 MG tablet 30 tablet 0 02/20/2012 08/18/2012 Sig - Route: Take 1 tablet (0.5 mg total) by mouth every 6 (six) hours as needed (Nausea  or vomiting). - Oral Class: Print Number of times this order has been changed since signing: 1 Order [address]it Trail ondansetron (ZOFRAN) 8 MG tablet 30 tablet 1 02/20/2012 02/19/2013 Sig: Take 1 tablet two times a day starting the day after chemo for 3 days. Then take 1 tab two times a day as needed for nausea or vomiting. Number of times this order has been changed since signing: 1 Order [address]it Trail oxyCODONE (OXY IR/ROXICODONE) 5 MG immediate release tablet 90 tablet 0 02/15/2012 02/25/2012 Sig - Route: Take 3 tablets (15 mg total) by mouth every 4 (four) hours as needed for pain. - Oral Class: Print Number of times this order has been changed since signing: 1 Order [address] Trail prochlorperazine (COMPAZINE) 10 MG tablet 30 tablet 1 02/20/2012 02/19/2013 Sig - Route: Take 1 tablet (10 mg total) by mouth every 6 (six) hours as needed (Nausea or vomiting). - Oral Non-formulary Exception Code: Dosage strength/form Number of times this order has been changed since signing: 1 Order [address] Trail prochlorperazine (COMPAZINE) 25 MG suppository 12 suppository 3 02/20/2012 02/19/2013 Sig - Route: Place 1 suppository (25 mg total) rectally every 12 (twelve) hours as needed for nausea. - Rectal Non-formulary Exception Code: Dosage strength/form Number of times this order has been changed since signing: 1 Order [address] Trail ranitidine (ZANTAC) 150 MG tablet Sig - Route: Take 150 mg by mouth 2 (two) times daily. - Oral  Please HPI for pertinent positives, otherwise complete 10 system ROS negative.  Physical Exam: Blood pressure 135/72, pulse 111, temperature 97.3 F (36.3 C), temperature source Oral, resp. rate 18, height 5\' 4"  (1.626 m), weight 211 lb (95.709 kg), last menstrual period 01/25/2012, SpO2 100.00%. Body mass index is 36.22 kg/(m^2).   General Appearance:  Alert, cooperative, no distress, appears stated age  Head:  Normocephalic, without obvious abnormality, atraumatic  ENT: Unremarkable  Neck: Supple, symmetrical,  trachea midline, no adenopathy, thyroid: not enlarged, symmetric, no tenderness/mass/nodules  Lungs:   Clear to auscultation bilaterally, no w/r/r, respirations unlabored without use of accessory muscles.  Chest Wall:  No tenderness or deformity. Small cutaneous horn vs actinic keratosis of rt chest wall, no evidence of acute infection.  Heart:  Regular rate and rhythm, S1, S2 normal, no murmur, rub or gallop. Carotids 2+ without bruit.  Abdomen:   Soft, non-tender, non distended. Bowel sounds active all four quadrants,  no masses, no organomegaly.  Extremities: Extremities normal, atraumatic, no cyanosis or edema  Neurologic: Normal affect, no gross deficits.   Results for orders placed during the hospital encounter of 02/23/12 (from the past 48 hour(s))  CBC     Status: Normal   Collection Time   02/23/12  1:10 PM      Component Value Range Comment   WBC 7.6  4.0 - 10.5 K/uL    RBC 4.34  3.87 - 5.11 MIL/uL    Hemoglobin 12.8  12.0 - 15.0 g/dL    HCT 16.1  09.6 - 04.5 %    MCV 87.6  78.0 - 100.0 fL    MCH 29.5  26.0 - 34.0 pg    MCHC 33.7  30.0 - 36.0 g/dL    RDW 40.9  81.1 - 91.4 %    Platelets 218  150 - 400 K/uL    No results found.  Assessment/Plan Stage IV breast cancer Discussed portacath procedure today with pt and family. Risks, complications, and post-op care explained. Labs pending. Consent signed in chart.  Brayton El PA-C 02/23/2012, 1:57 PM

## 2012-02-23 NOTE — Progress Notes (Signed)
At timeout 1610 It was noted that patient has allergy to cephalsporins. Ancef was antibiotic spiked to give. At this point in time IV tubing was primed. At this point we stopped timeout process to obtain correct antibiotic. It was then noted that some Ancef fluid had dripped into IV chamber. At this point Dr. Lowella Dandy decided to cancel case. Patient was ordered and given Benadryl 25 mg IV as a precaution. Patient informed of above as well as husband. Patient has no c/o or allergic reaction. No rash noted.VS Temp 99.0 orally B/P heart rate (HR) 71 resp rate (RR) Sp02 97% room air (RA). 1625 B/P 118/78 HR 72 RR 16 Sp02 95 RA. No c/o allergic reaction. No symptoms noted. 1640 Tolerate po's well. B/P 126/56 HR 71 RR14 Sp02 95 RA. Dr. Lowella Dandy in to see patient. Heart and lungs auscultated. 1655 B/P 118/60 HR 71 RR 14 Sp02 95 RA. Tol po's well ate sandwich.  1710 B/P 135/66 HR 70 RR 16 Sp02 95 RA IV d/c'd. Cath tip intact, site satisf.    1725 Dr. Lowella Dandy in to see patient no c/o allergic reaction. No symptoms noted. Patient ready to dress. Instructions given to patient and husband for return appointment. Voices understanding. 1730 Discharged via wheelchair in c/o husband.

## 2012-02-23 NOTE — Progress Notes (Signed)
Patient ID: Vicki Sanders, female   DOB: [birth year], 46 y.o.   MRN: [id number] Patient scheduled for port-a-cath placement.  A timeout was performed prior to the procedure and the patient's cephalosporin allergy was acknowledged. Vancomyocin was ordered for the procedure.  However,  Ancef was already hanging and connected to patient's IV.  It was unclear how much of the Ancef that the patient was given.  The nurse felt that most of drug was still in the IV line.  The procedure was cancelled as a precautionary measure.  The timeout was performed at 16:10.  The patient was given Benadryl 25 mg IV for prophylaxis.  The patient has been continuously monitored by the radiology nurse and has remained asymptomatic.   Vitals at 1655:  BP=118/60 mmHg; P=71; R=14; O2 sats=95% RA   Exam:  Lungs: clear bilaterally; Heart: RRR; Skin; no rash  Plan to monitor patient for another 30 minutes and then discharge home with family.  Patient has been re-scheduled for the port placement.

## 2012-02-26 ENCOUNTER — Other Ambulatory Visit: Payer: Self-pay | Admitting: Radiology

## 2012-02-27 ENCOUNTER — Other Ambulatory Visit: Payer: Self-pay | Admitting: Radiology

## 2012-02-27 ENCOUNTER — Ambulatory Visit (HOSPITAL_BASED_OUTPATIENT_CLINIC_OR_DEPARTMENT_OTHER): Payer: Medicaid Other | Admitting: Oncology

## 2012-02-27 ENCOUNTER — Other Ambulatory Visit (HOSPITAL_BASED_OUTPATIENT_CLINIC_OR_DEPARTMENT_OTHER): Payer: Medicaid Other | Admitting: Lab

## 2012-02-27 VITALS — BP 119/79 | HR 114 | Temp 98.3°F | Ht 64.0 in | Wt 206.9 lb

## 2012-02-27 DIAGNOSIS — C7951 Secondary malignant neoplasm of bone: Secondary | ICD-10-CM

## 2012-02-27 DIAGNOSIS — R17 Unspecified jaundice: Secondary | ICD-10-CM

## 2012-02-27 DIAGNOSIS — C7952 Secondary malignant neoplasm of bone marrow: Secondary | ICD-10-CM

## 2012-02-27 DIAGNOSIS — C787 Secondary malignant neoplasm of liver and intrahepatic bile duct: Secondary | ICD-10-CM

## 2012-02-27 DIAGNOSIS — C50919 Malignant neoplasm of unspecified site of unspecified female breast: Secondary | ICD-10-CM

## 2012-02-27 LAB — CBC WITH DIFFERENTIAL/PLATELET
Basophils Absolute: 0.1 10*3/uL (ref 0.0–0.1)
Eosinophils Absolute: 0.1 10*3/uL (ref 0.0–0.5)
HGB: 13 g/dL (ref 11.6–15.9)
MONO#: 0.6 10*3/uL (ref 0.1–0.9)
NEUT#: 5.7 10*3/uL (ref 1.5–6.5)
RDW: 15.2 % — ABNORMAL HIGH (ref 11.2–14.5)
lymph#: 1.5 10*3/uL (ref 0.9–3.3)

## 2012-02-27 LAB — COMPREHENSIVE METABOLIC PANEL
Albumin: 2.9 g/dL — ABNORMAL LOW (ref 3.5–5.2)
CO2: 26 mEq/L (ref 19–32)
Glucose, Bld: 193 mg/dL — ABNORMAL HIGH (ref 70–99)
Potassium: 4.3 mEq/L (ref 3.5–5.3)
Sodium: 133 mEq/L — ABNORMAL LOW (ref 135–145)
Total Protein: 5.7 g/dL — ABNORMAL LOW (ref 6.0–8.3)

## 2012-02-27 NOTE — Progress Notes (Signed)
OFFICE PROGRESS NOTE  CC Dr. Emelia Loron  DIAGNOSIS: 46 year old female with New diagnosis of widely metastatic Breast cancer diagnosed 02/14/2012 by a needle core biopsy of the right breast. Her needle core biopsy was performed at [facility] and the pathology was reviewed by Dr. Delena Bali. Her was ER-positive 5% PR negative HER-2/neu overexpressed Ki-67 50%  PRIOR THERAPY:  #1 patient originally presented to my clinic on 01/23/2012 with a right breast mass and flank pain. At that time I ordered staging studies and ordered mammogram as well as an ultrasound.The ultrasound showed a irregular hypoechoic mass in the 9:00 position 11 cm from the nipple measuring 2.9 x 2.8 x 3.3 cm. Another irregular hypo-: Mass was noted in the 9:00 position 9 cm from the nipple measuring 0.9 x 1.0 x 0.9 cm skin thickening was noted.And abnormal node was imaged in the right axilla with loss of fatty hilum measuring 1.9 x 0.2 x 1.1 cm. Left breast was negative for a malignancy. Performed on the same day revealed in the right breast posteriorly in the upper outer quadrant irregular spiculated mass 1 cm anterior in the upper outer quadrant area measured 4.8 x 6.7 cm in conglomeration with skin thickening.  #2 PET CT revealed 2 hypermetabolic masses in the posterior right breast consistent with primary breast carcinoma hypermetabolic subcutaneous tissue on the right possibly representing inflammatory carcinoma. There was a local right axillary nodal metastasis. Also noted were hypermetabolic internal mammary nodal metastasis on the right. Hepatic metastases with large tumor burden within the left and right hepatic lobe the lesions were intensely hypermetabolic with SUV 15.0 there also noted to be periportal nodal metastases. Widespread hypermetabolic skeletal metastases involving the axillary and appendicular skeleton including the skull base. There are ill-defined pulmonary nodules without associated metabolic  activity.[name] #3 patient had a right breast biopsy performed on 02/14/2012. 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 The prognostic panel reveals estrogen receptor 5% moderately staining progesterone receptor 0% HER-2/neu strong +3 and overexpressed proliferation marker Ki-67 50% and elevated.  3 patient has been seen by Dr. Emelia Loron in consultation  CURRENT THERAPY:. Patient will begin Taxotere carboplatinum and Herceptin once she has her Port-A-Cath placed   INTERVAL HISTORY: Analya Louissaint 46 y.o. female returns for Up visit today She is still waiting to have a Port-A-Cath placed. She was scheduled last week unfortunately apparently patient was given a cephalosporin which she is allergic to and thereafter the Port-A-Cath was counseled. She is now rescheduled to have this done tomorrow. She will then proceed with her chemotherapy on Thursday, June 27. She understands the risks and benefits. Patient is complaining of being nauseous and she has been taking Zofran off-and-on and I recommended that she continue to do this on a regular basis. She is also noticed to be quite icteric and likely due to progressive disease in her liver. Patient's pain is well-controlled since she has been on phenyl patches. I've encouraged her to continue using these. Patient has not been taking any acetaminophen which is good I would be concerned with tumor excessive use of acetaminophen due to patient's liver function studies. Overall patient's is a very high risk individual and I am concerned about her overall health and well-being and concerned about how well she  may respond to this chemotherapy she understands that she may have significant toxicity oh  for all due to her very poor performance status and progressive disease but we will continue to support her completely. Remainder of the 10 point review of systems is negative. MEDICAL HISTORY: Past Medical History  Diagnosis Date  . Diabetes mellitus   . Arthritis   . Cancer 01/23/2012    ALLERGIES:  is allergic to cephalosporins.  MEDICATIONS:  Current Outpatient Prescriptions  Medication Sig Dispense Refill  . dexamethasone (DECADRON) 4 MG tablet Take 2 tablets two times a day the day before Taxotere. Then take 2 tabs two times a day starting the day after chemo for 3 days.  30 tablet  1  . fentaNYL (DURAGESIC - DOSED MCG/HR) 50 MCG/HR Place 1 patch (50 mcg total) onto the skin every 3 (three) days.  10 patch  0  . lidocaine-prilocaine (EMLA) cream Apply topically as needed.  30 g  6  . LORazepam (ATIVAN) 0.5 MG tablet Take 1 tablet (0.5 mg total) by mouth every 6 (six) hours as needed (Nausea or vomiting).  30 tablet  0  . ondansetron (ZOFRAN) 8 MG tablet Take 1 tablet two times a day starting the day after chemo for 3 days. Then take 1 tab two times a day as needed for nausea or vomiting.  30 tablet  1  . prochlorperazine (COMPAZINE) 10 MG tablet Take 1 tablet (10 mg total) by mouth every 6 (six) hours as needed (Nausea or vomiting).  30 tablet  1  . prochlorperazine (COMPAZINE) 25 MG suppository Place 1 suppository (25 mg total) rectally every 12 (twelve) hours as needed for nausea.  12 suppository  3  . ranitidine (ZANTAC) 150 MG tablet Take 150 mg by mouth 2 (two) times daily.        SURGICAL HISTORY:  Past Surgical History  Procedure Date  . Shoulder arthrotomy   . Tonsillectomy   . Appendectomy   . Cesarean section     REVIEW OF SYSTEMS:  Pertinent items are noted in HPI.   PHYSICAL EXAMINATION: General appearance: alert, cooperative, appears older than stated age, fatigued, flushed, mild distress, mildly obese and pale Neck: no adenopathy, no carotid bruit, no JVD,  supple, symmetrical, trachea midline and thyroid not enlarged, symmetric, no tenderness/mass/nodules Lymph nodes: Cervical, supraclavicular, and axillary nodes normal. Resp: clear to auscultation bilaterally and normal percussion bilaterally Back: he does have some tenderness diffusely Cardio: regular rate and rhythm, S1, S2 normal, no murmur, click, rub or gallop GI: abdomen is soft liver is palpable 2 fingerbreadths below the right costal margin no splenomegaly no other palpable masses bowel sounds are present Extremities: extremities normal, atraumatic, no cyanosis or edema Neurologic: Grossly normal Bilateral breast examination right breast reveals a palpable mass measuring about 5 cm it is tender to palpation no nipple discharge there are some thickening of the skin and skin changes. Left breast no masses nipple discharge or skin changes. ECOG PERFORMANCE STATUS: 1 - Symptomatic but completely ambulatory  Blood pressure 119/79, pulse 114, temperature 98.3 F (36.8 C), temperature source Oral, height 5\' 4"  (1.626 m), weight 206 lb 14.4 oz (93.849 kg), last menstrual period 01/25/2012.  LABORATORY DATA: Lab Results  Component Value Date   WBC 7.9 02/27/2012   HGB 13.0 02/27/2012   HCT 40.1 02/27/2012   MCV 88.3 02/27/2012   PLT 232 02/27/2012      Chemistry      Component Value Date/Time   NA 134*  02/20/2012 1146   K 4.3 02/20/2012 1146   CL 98 02/20/2012 1146   CO2 26 02/20/2012 1146   BUN 10 02/20/2012 1146   CREATININE 0.63 02/20/2012 1146      Component Value Date/Time   CALCIUM 9.2 02/20/2012 1146   ALKPHOS 499* 02/20/2012 1146   AST 112* 02/20/2012 1146   ALT 49* 02/20/2012 1146   BILITOT 5.1* 02/20/2012 1146       RADIOGRAPHIC STUDIES:  ASSESSMENT: 46 year old female with  #1 new diagnosis of 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 Patient is status post needle  core biopsy of the right breast mass. The final pathology revealed a invasive ductal carcinoma high-grade with LDI tumor ER +5% PR -0% HER-2/neu overexpressed 3+ Ki-67 50%.  #2 patient's staging studies reveal widely metastatic disease including liver and bone metastases and nodal metastasis.  #3 recommendation is Herceptin based systemic chemotherapy. I have recommended Taxotere carboplatinum and Herceptin. The Taxotere carboplatinum will be given every 3 weeks with Herceptin given weekly. The Taxotere carboplatinum will be given for a total of 6 cycles. She will receive day 2 Neulasta.  #4 after patient has had at least 3-4 cycles of chemotherapy we will do restaging studies including a PET/CT's.  #5 patient will eventually require ongoing therapy with Herceptin and antiestrogen likely consisting of tamoxifen since patient is premenopausal.  #6 for bony metastasis we will start XGeva every 28 days.   PLAN:  #1 patient is awaiting Port-A-Cath placement which will be tomorrow. Thereafter she will begin first cycle of chemotherapy consisting of Taxotere carboplatinum and Herceptin.Patient has had chemotherapy teaching class performed already she has all of her anti-emetics.  #2 patient is nauseous and I have recommended that she take the Zofran and Compazine.  #3 jaundice likely due to her underlying disease in this is why I do think that there is an urgency to getting her started on systemic chemotherapy due to progressive disease.  #4 patient will be seen back in one week's time for followup or sooner if need arises.  #5 patient's pain is under control with fentanyl patches.   All questions were answered. The patient knows to call the clinic with any problems, questions or concerns. We can certainly see the patient much sooner if necessary.  I spent 30 minutes counseling the patient face to face. The total time spent in the appointment was 30 minutes.    Drue Second,  MD Medical/Oncology [facility] [phone] (beeper) [phone] (Office)  02/27/2012, 2:08 PM

## 2012-02-27 NOTE — Patient Instructions (Signed)
1. Port placement on Wednesday  2 For nausea otake zofran and compazine  3. Call with any questions

## 2012-02-28 ENCOUNTER — Other Ambulatory Visit: Payer: Self-pay | Admitting: Oncology

## 2012-02-28 ENCOUNTER — Encounter (HOSPITAL_COMMUNITY): Payer: Self-pay

## 2012-02-28 ENCOUNTER — Ambulatory Visit (HOSPITAL_COMMUNITY)
Admission: RE | Admit: 2012-02-28 | Discharge: 2012-02-28 | Disposition: A | Discharge status: Home / Self Care | Payer: Medicaid Other | Source: Ambulatory Visit | Attending: Oncology | Admitting: Oncology

## 2012-02-28 DIAGNOSIS — C7951 Secondary malignant neoplasm of bone: Secondary | ICD-10-CM

## 2012-02-28 DIAGNOSIS — C50919 Malignant neoplasm of unspecified site of unspecified female breast: Secondary | ICD-10-CM

## 2012-02-28 DIAGNOSIS — E119 Type 2 diabetes mellitus without complications: Secondary | ICD-10-CM | POA: Insufficient documentation

## 2012-02-28 LAB — PROTIME-INR
INR: 1.06 (ref 0.00–1.49)
Prothrombin Time: 14 seconds (ref 11.6–15.2)

## 2012-02-28 MED ORDER — MIDAZOLAM HCL 5 MG/5ML IJ SOLN
INTRAMUSCULAR | Status: AC | PRN
  Administered 2012-02-28 (×2): 2 mg via INTRAVENOUS

## 2012-02-28 MED ORDER — FENTANYL CITRATE 0.05 MG/ML IJ SOLN
INTRAMUSCULAR | Status: AC
  Filled 2012-02-28: qty 4

## 2012-02-28 MED ORDER — MIDAZOLAM HCL 2 MG/2ML IJ SOLN
INTRAMUSCULAR | Status: AC
  Filled 2012-02-28: qty 4

## 2012-02-28 MED ORDER — SODIUM CHLORIDE 0.9 % IV SOLN
Freq: Once | INTRAVENOUS | Status: AC
  Administered 2012-02-28: 13:00:00 via INTRAVENOUS

## 2012-02-28 MED ORDER — FENTANYL CITRATE 0.05 MG/ML IJ SOLN
INTRAMUSCULAR | Status: AC | PRN
  Administered 2012-02-28: 100 ug via INTRAVENOUS

## 2012-02-28 MED ORDER — VANCOMYCIN HCL IN DEXTROSE 1-5 GM/200ML-% IV SOLN
1000.0000 mg | Freq: Once | INTRAVENOUS | Status: AC
  Administered 2012-02-28: 1000 mg via INTRAVENOUS

## 2012-02-28 MED ORDER — HEPARIN SOD (PORK) LOCK FLUSH 100 UNIT/ML IV SOLN
500.0000 [IU] | Freq: Once | INTRAVENOUS | Status: AC
  Administered 2012-02-28: 500 [IU] via INTRAVENOUS

## 2012-02-28 NOTE — Procedures (Signed)
Successful placement of left IJ approach port-a-cath with tip at superior aspect of the right atrium. The catheter is ready for immediate use. No immediate post procedural complications.  

## 2012-02-28 NOTE — H&P (View-Only) (Signed)
Chief Complaint: Stage IV breast cancer Referring Physician:Khan HPI: Vicki Sanders is an 46 y.o. female with stage IV breast CA who is referred for port placement to begin chemotherapy.  Past Medical History:  Past Medical History  Diagnosis Date  . Diabetes mellitus   . Arthritis   . Cancer 01/23/2012    Past Surgical History:  Past Surgical History  Procedure Date  . Shoulder arthrotomy   . Tonsillectomy   . Appendectomy   . Cesarean section     Family History:  Family History  Problem Relation Age of Onset  . Cancer Mother 63    lung cancer  . Diabetes Father   . Diabetes Sister   . Cancer Maternal Aunt     bilateral cancers, mastectomy  . Cancer Paternal Grandmother     bone cancer    Social History:  reports that she quit smoking about 4 weeks ago. She has never used smokeless tobacco. She reports that she does not drink alcohol or use illicit drugs.  Allergies:  Allergies  Allergen Reactions  . Cephalosporins Hives and Itching    Medications: dexamethasone (DECADRON) 4 MG tablet 30 tablet 1 02/20/2012 02/19/2013 Sig: Take 2 tablets two times a day the day before Taxotere. Then take 2 tabs two times a day starting the day after chemo for 3 days. Non-formulary Exception Code: Dosage strength/form Number of times this order has been changed since signing: 1 Order [address]it Trail fentaNYL (DURAGESIC - DOSED MCG/HR) 50 MCG/HR 10 patch 0 02/20/2012 03/21/2012 Sig - Route: Place 1 patch (50 mcg total) onto the skin every 3 (three) days. - Transdermal Class: Print Number of times this order has been changed since signing: 1 Order [address] Trail lidocaine-prilocaine (EMLA) cream 30 g 6 02/20/2012 02/19/2013 Sig - Route: Apply topically as needed. - Topical Number of times this order has been changed since signing: 1 Order [address] Trail LORazepam (ATIVAN) 0.5 MG tablet 30 tablet 0 02/20/2012 08/18/2012 Sig - Route: Take 1 tablet (0.5 mg total) by mouth every 6 (six) hours as needed (Nausea  or vomiting). - Oral Class: Print Number of times this order has been changed since signing: 1 Order [address] Trail ondansetron (ZOFRAN) 8 MG tablet 30 tablet 1 02/20/2012 02/19/2013 Sig: Take 1 tablet two times a day starting the day after chemo for 3 days. Then take 1 tab two times a day as needed for nausea or vomiting. Number of times this order has been changed since signing: 1 Order [address]it Trail oxyCODONE (OXY IR/ROXICODONE) 5 MG immediate release tablet 90 tablet 0 02/15/2012 02/25/2012 Sig - Route: Take 3 tablets (15 mg total) by mouth every 4 (four) hours as needed for pain. - Oral Class: Print Number of times this order has been changed since signing: 1 Order [address] Trail prochlorperazine (COMPAZINE) 10 MG tablet 30 tablet 1 02/20/2012 02/19/2013 Sig - Route: Take 1 tablet (10 mg total) by mouth every 6 (six) hours as needed (Nausea or vomiting). - Oral Non-formulary Exception Code: Dosage strength/form Number of times this order has been changed since signing: 1 Order [address] Trail prochlorperazine (COMPAZINE) 25 MG suppository 12 suppository 3 02/20/2012 02/19/2013 Sig - Route: Place 1 suppository (25 mg total) rectally every 12 (twelve) hours as needed for nausea. - Rectal Non-formulary Exception Code: Dosage strength/form Number of times this order has been changed since signing: 1 Order [address] Trail ranitidine (ZANTAC) 150 MG tablet Sig - Route: Take 150 mg by mouth 2 (two) times daily. - Oral     Please HPI for pertinent positives, otherwise complete 10 system ROS negative.  Physical Exam: Blood pressure 135/72, pulse 111, temperature 97.3 F (36.3 C), temperature source Oral, resp. rate 18, height 5' 4" (1.626 m), weight 211 lb (95.709 kg), last menstrual period 01/25/2012, SpO2 100.00%. Body mass index is 36.22 kg/(m^2).   General Appearance:  Alert, cooperative, no distress, appears stated age  Head:  Normocephalic, without obvious abnormality, atraumatic  ENT: Unremarkable  Neck: Supple, symmetrical,  trachea midline, no adenopathy, thyroid: not enlarged, symmetric, no tenderness/mass/nodules  Lungs:   Clear to auscultation bilaterally, no w/r/r, respirations unlabored without use of accessory muscles.  Chest Wall:  No tenderness or deformity. Small cutaneous horn vs actinic keratosis of rt chest wall, no evidence of acute infection.  Heart:  Regular rate and rhythm, S1, S2 normal, no murmur, rub or gallop. Carotids 2+ without bruit.  Abdomen:   Soft, non-tender, non distended. Bowel sounds active all four quadrants,  no masses, no organomegaly.  Extremities: Extremities normal, atraumatic, no cyanosis or edema  Neurologic: Normal affect, no gross deficits.   Results for orders placed during the hospital encounter of 02/23/12 (from the past 48 hour(s))  CBC     Status: Normal   Collection Time   02/23/12  1:10 PM      Component Value Range Comment   WBC 7.6  4.0 - 10.5 K/uL    RBC 4.34  3.87 - 5.11 MIL/uL    Hemoglobin 12.8  12.0 - 15.0 g/dL    HCT 38.0  36.0 - 46.0 %    MCV 87.6  78.0 - 100.0 fL    MCH 29.5  26.0 - 34.0 pg    MCHC 33.7  30.0 - 36.0 g/dL    RDW 14.4  11.5 - 15.5 %    Platelets 218  150 - 400 K/uL    No results found.  Assessment/Plan Stage IV breast cancer Discussed portacath procedure today with pt and family. Risks, complications, and post-op care explained. Labs pending. Consent signed in chart.  Kentaro Alewine PA-C 02/23/2012, 1:57 PM     

## 2012-02-28 NOTE — Interval H&P Note (Cosign Needed)
History and Physical Interval Note:  02/28/2012 10:17 AM  Vista Mink  has presented today for port placement, with the diagnosis of metastatic breast cancer.  The various methods of treatment have been discussed with the patient and family. After consideration of risks, benefits and other options for treatment, the patient has consented to portacath placement intervention .  The patient's history has been reviewed, patient examined, no change in status, stable for surgery. She has become more weak and jaundiced since last week.  I have reviewed the patients' chart and labs.  Questions were answered to the patient's satisfaction.   BP 122/67  Pulse 105  Temp 98.9 F (37.2 C) (Oral)  Resp 18  Ht 5\' 4"  (1.626 m)  Wt 206 lb (93.441 kg)  BMI 35.36 kg/m2  SpO2 96%  LMP 01/25/2012 Lungs: CTA Heart: Reg Skin: jaundiced.  Seen for Dr. Wardell Heath, [facility] PA-C

## 2012-02-28 NOTE — Discharge Instructions (Signed)

## 2012-02-29 ENCOUNTER — Other Ambulatory Visit: Payer: Self-pay | Admitting: Oncology

## 2012-02-29 ENCOUNTER — Ambulatory Visit (HOSPITAL_BASED_OUTPATIENT_CLINIC_OR_DEPARTMENT_OTHER): Payer: Medicaid Other

## 2012-02-29 VITALS — BP 144/77 | HR 100 | Temp 98.0°F

## 2012-02-29 DIAGNOSIS — C7951 Secondary malignant neoplasm of bone: Secondary | ICD-10-CM

## 2012-02-29 DIAGNOSIS — C50919 Malignant neoplasm of unspecified site of unspecified female breast: Secondary | ICD-10-CM

## 2012-02-29 DIAGNOSIS — Z5111 Encounter for antineoplastic chemotherapy: Secondary | ICD-10-CM

## 2012-02-29 DIAGNOSIS — C787 Secondary malignant neoplasm of liver and intrahepatic bile duct: Secondary | ICD-10-CM

## 2012-02-29 MED ORDER — SODIUM CHLORIDE 0.9 % IJ SOLN
10.0000 mL | INTRAMUSCULAR | Status: DC | PRN
  Administered 2012-02-29: 10 mL
  Filled 2012-02-29: qty 10

## 2012-02-29 MED ORDER — DEXAMETHASONE SODIUM PHOSPHATE 4 MG/ML IJ SOLN
20.0000 mg | Freq: Once | INTRAMUSCULAR | Status: AC
  Administered 2012-02-29: 20 mg via INTRAVENOUS

## 2012-02-29 MED ORDER — HEPARIN SOD (PORK) LOCK FLUSH 100 UNIT/ML IV SOLN
500.0000 [IU] | Freq: Once | INTRAVENOUS | Status: AC | PRN
  Administered 2012-02-29: 500 [IU]
  Filled 2012-02-29: qty 5

## 2012-02-29 MED ORDER — DIPHENHYDRAMINE HCL 25 MG PO CAPS
50.0000 mg | ORAL_CAPSULE | Freq: Once | ORAL | Status: AC
  Administered 2012-02-29: 50 mg via ORAL

## 2012-02-29 MED ORDER — ONDANSETRON 16 MG/50ML IVPB (CHCC)
16.0000 mg | Freq: Once | INTRAVENOUS | Status: AC
  Administered 2012-02-29: 16 mg via INTRAVENOUS

## 2012-02-29 MED ORDER — SODIUM CHLORIDE 0.9 % IV SOLN
Freq: Once | INTRAVENOUS | Status: AC
  Administered 2012-02-29: 08:00:00 via INTRAVENOUS

## 2012-02-29 MED ORDER — SODIUM CHLORIDE 0.9 % IV SOLN
900.0000 mg | Freq: Once | INTRAVENOUS | Status: AC
  Administered 2012-02-29: 900 mg via INTRAVENOUS
  Filled 2012-02-29: qty 90

## 2012-02-29 MED ORDER — TRASTUZUMAB CHEMO INJECTION 440 MG
4.0000 mg/kg | Freq: Once | INTRAVENOUS | Status: AC
  Administered 2012-02-29: 378 mg via INTRAVENOUS
  Filled 2012-02-29: qty 18

## 2012-02-29 MED ORDER — DOCETAXEL CHEMO INJECTION 160 MG/16ML
37.5000 mg/m2 | Freq: Once | INTRAVENOUS | Status: AC
  Administered 2012-02-29: 80 mg via INTRAVENOUS
  Filled 2012-02-29: qty 8

## 2012-02-29 MED ORDER — ACETAMINOPHEN 325 MG PO TABS
650.0000 mg | ORAL_TABLET | Freq: Once | ORAL | Status: AC
  Administered 2012-02-29: 650 mg via ORAL

## 2012-02-29 NOTE — Patient Instructions (Signed)
[facility] Discharge Instructions for Patients Receiving Chemotherapy  Today you received the following chemotherapy agents Taxotere, Carboplatin, Herceptin   To help prevent nausea and vomiting after your treatment, we encourage you to take your nausea medication as instructed.  Begin taking it at 5pm and take it as often as prescribed for the next 24-72 hours.   If you develop nausea and vomiting that is not controlled by your nausea medication, call the clinic. If it is after clinic hours your family physician or the after hours number for the clinic or go to the Emergency Department.   BELOW ARE SYMPTOMS THAT SHOULD BE REPORTED IMMEDIATELY:  *FEVER GREATER THAN 100.5 F  *CHILLS WITH OR WITHOUT FEVER  NAUSEA AND VOMITING THAT IS NOT CONTROLLED WITH YOUR NAUSEA MEDICATION  *UNUSUAL SHORTNESS OF BREATH  *UNUSUAL BRUISING OR BLEEDING  TENDERNESS IN MOUTH AND THROAT WITH OR WITHOUT PRESENCE OF ULCERS  *URINARY PROBLEMS  *BOWEL PROBLEMS  UNUSUAL RASH Items with * indicate a potential emergency and should be followed up as soon as possible.  One of the nurses will contact you 24 hours after your treatment. Please let the nurse know about any problems that you may have experienced. Feel free to call the clinic you have any questions or concerns. The clinic phone number is [phone].   I have been informed and understand all the instructions given to me. I know to contact the clinic, my physician, or go to the Emergency Department if any problems should occur. I do not have any questions at this time, but understand that I may call the clinic during office hours   should I have any questions or need assistance in obtaining follow up care.    __________________________________________  _____________  __________ Signature of Patient or Authorized Representative            Date                   Time    __________________________________________ Nurse's  Signature  Docetaxel injection/ Taxotere  What is this medicine? DOCETAXEL (doe se TAX el) is a chemotherapy drug. It targets fast dividing cells, like cancer cells, and causes these cells to die. This medicine is used to treat many types of cancers like breast cancer, certain stomach cancers, head and neck cancer, lung cancer, and prostate cancer. This medicine may be used for other purposes; ask your health care provider or pharmacist if you have questions. What should I tell my health care provider before I take this medicine? They need to know if you have any of these conditions: -infection (especially a virus infection such as chickenpox, cold sores, or herpes) -liver disease -low blood counts, like low white cell, platelet, or red cell counts -an unusual or allergic reaction to docetaxel, polysorbate 80, other chemotherapy agents, other medicines, foods, dyes, or preservatives -pregnant or trying to get pregnant -breast-feeding How should I use this medicine? This drug is given as an infusion into a vein. It is administered in a hospital or clinic by a specially trained health care professional. Talk to your pediatrician regarding the use of this medicine in children. Special care may be needed. Overdosage: If you think you have taken too much of this medicine contact a poison control center or emergency room at once. NOTE: This medicine is only for you. Do not share this medicine with others. What if I miss a dose? It is important not to miss your dose. Call your doctor  or health care professional if you are unable to keep an appointment. What may interact with this medicine? -cyclosporine -erythromycin -ketoconazole -medicines to increase blood counts like filgrastim, pegfilgrastim, sargramostim -vaccines Talk to your doctor or health care professional before taking any of these medicines: -acetaminophen -aspirin -ibuprofen -ketoprofen -naproxen This list may not describe  all possible interactions. Give your health care provider a list of all the medicines, herbs, non-prescription drugs, or dietary supplements you use. Also tell them if you smoke, drink alcohol, or use illegal drugs. Some items may interact with your medicine. What should I watch for while using this medicine? Your condition will be monitored carefully while you are receiving this medicine. You will need important blood work done while you are taking this medicine. This drug may make you feel generally unwell. This is not uncommon, as chemotherapy can affect healthy cells as well as cancer cells. Report any side effects. Continue your course of treatment even though you feel ill unless your doctor tells you to stop. In some cases, you may be given additional medicines to help with side effects. Follow all directions for their use. Call your doctor or health care professional for advice if you get a fever, chills or sore throat, or other symptoms of a cold or flu. Do not treat yourself. This drug decreases your body's ability to fight infections. Try to avoid being around people who are sick. This medicine may increase your risk to bruise or bleed. Call your doctor or health care professional if you notice any unusual bleeding. Be careful brushing and flossing your teeth or using a toothpick because you may get an infection or bleed more easily. If you have any dental work done, tell your dentist you are receiving this medicine. Avoid taking products that contain aspirin, acetaminophen, ibuprofen, naproxen, or ketoprofen unless instructed by your doctor. These medicines may hide a fever. Do not become pregnant while taking this medicine. Women should inform their doctor if they wish to become pregnant or think they might be pregnant. There is a potential for serious side effects to an unborn child. Talk to your health care professional or pharmacist for more information. Do not breast-feed an infant while  taking this medicine. What side effects may I notice from receiving this medicine? Side effects that you should report to your doctor or health care professional as soon as possible: -allergic reactions like skin rash, itching or hives, swelling of the face, lips, or tongue -low blood counts - This drug may decrease the number of white blood cells, red blood cells and platelets. You may be at increased risk for infections and bleeding. -signs of infection - fever or chills, cough, sore throat, pain or difficulty passing urine -signs of decreased platelets or bleeding - bruising, pinpoint red spots on the skin, black, tarry stools, nosebleeds -signs of decreased red blood cells - unusually weak or tired, fainting spells, lightheadedness -breathing problems -fast or irregular heartbeat -low blood pressure -mouth sores -nausea and vomiting -pain, swelling, redness or irritation at the injection site -pain, tingling, numbness in the hands or feet -swelling of the ankle, feet, hands -weight gain Side effects that usually do not require medical attention (report to your prescriber or health care professional if they continue or are bothersome): -bone pain -complete hair loss including hair on your head, underarms, pubic hair, eyebrows, and eyelashes -diarrhea -excessive tearing -changes in the color of fingernails -loosening of the fingernails -nausea -muscle pain -red flush to skin -sweating -weak  or tired This list may not describe all possible side effects. Call your doctor for medical advice about side effects. You may report side effects to FDA at 1-800-FDA-1088. Where should I keep my medicine? This drug is given in a hospital or clinic and will not be stored at home.  Carboplatin injection  What is this medicine? CARBOPLATIN (KAR boe pla tin) is a chemotherapy drug. It targets fast dividing cells, like cancer cells, and causes these cells to die. This medicine is used to treat  ovarian cancer and many other cancers. This medicine may be used for other purposes; ask your health care provider or pharmacist if you have questions. What should I tell my health care provider before I take this medicine? They need to know if you have any of these conditions: -blood disorders -hearing problems -kidney disease -recent or ongoing radiation therapy -an unusual or allergic reaction to carboplatin, cisplatin, other chemotherapy, other medicines, foods, dyes, or preservatives -pregnant or trying to get pregnant -breast-feeding How should I use this medicine? This drug is usually given as an infusion into a vein. It is administered in a hospital or clinic by a specially trained health care professional. Talk to your pediatrician regarding the use of this medicine in children. Special care may be needed. Overdosage: If you think you have taken too much of this medicine contact a poison control center or emergency room at once. NOTE: This medicine is only for you. Do not share this medicine with others. What if I miss a dose? It is important not to miss a dose. Call your doctor or health care professional if you are unable to keep an appointment. What may interact with this medicine? -medicines for seizures -medicines to increase blood counts like filgrastim, pegfilgrastim, sargramostim -some antibiotics like amikacin, gentamicin, neomycin, streptomycin, tobramycin -vaccines Talk to your doctor or health care professional before taking any of these medicines: -acetaminophen -aspirin -ibuprofen -ketoprofen -naproxen This list may not describe all possible interactions. Give your health care provider a list of all the medicines, herbs, non-prescription drugs, or dietary supplements you use. Also tell them if you smoke, drink alcohol, or use illegal drugs. Some items may interact with your medicine. What should I watch for while using this medicine? Your condition will be  monitored carefully while you are receiving this medicine. You will need important blood work done while you are taking this medicine. This drug may make you feel generally unwell. This is not uncommon, as chemotherapy can affect healthy cells as well as cancer cells. Report any side effects. Continue your course of treatment even though you feel ill unless your doctor tells you to stop. In some cases, you may be given additional medicines to help with side effects. Follow all directions for their use. Call your doctor or health care professional for advice if you get a fever, chills or sore throat, or other symptoms of a cold or flu. Do not treat yourself. This drug decreases your body's ability to fight infections. Try to avoid being around people who are sick. This medicine may increase your risk to bruise or bleed. Call your doctor or health care professional if you notice any unusual bleeding. Be careful brushing and flossing your teeth or using a toothpick because you may get an infection or bleed more easily. If you have any dental work done, tell your dentist you are receiving this medicine. Avoid taking products that contain aspirin, acetaminophen, ibuprofen, naproxen, or ketoprofen unless instructed by your doctor.  These medicines may hide a fever. Do not become pregnant while taking this medicine. Women should inform their doctor if they wish to become pregnant or think they might be pregnant. There is a potential for serious side effects to an unborn child. Talk to your health care professional or pharmacist for more information. Do not breast-feed an infant while taking this medicine. What side effects may I notice from receiving this medicine? Side effects that you should report to your doctor or health care professional as soon as possible: -allergic reactions like skin rash, itching or hives, swelling of the face, lips, or tongue -signs of infection - fever or chills, cough, sore throat,  pain or difficulty passing urine -signs of decreased platelets or bleeding - bruising, pinpoint red spots on the skin, black, tarry stools, nosebleeds -signs of decreased red blood cells - unusually weak or tired, fainting spells, lightheadedness -breathing problems -changes in hearing -changes in vision -chest pain -high blood pressure -low blood counts - This drug may decrease the number of white blood cells, red blood cells and platelets. You may be at increased risk for infections and bleeding. -nausea and vomiting -pain, swelling, redness or irritation at the injection site -pain, tingling, numbness in the hands or feet -problems with balance, talking, walking -trouble passing urine or change in the amount of urine Side effects that usually do not require medical attention (report to your doctor or health care professional if they continue or are bothersome): -hair loss -loss of appetite -metallic taste in the mouth or changes in taste This list may not describe all possible side effects. Call your doctor for medical advice about side effects. You may report side effects to FDA at 1-800-FDA-1088. Where should I keep my medicine? This drug is given in a hospital or clinic and will not be stored at home.  Trastuzumab injection for infusion/Herceptin  What is this medicine? TRASTUZUMAB (tras TOO zoo mab) is a monoclonal antibody. It targets a protein called HER2. This protein is found in some stomach and breast cancers. This medicine can stop cancer cell growth. This medicine may be used with other cancer treatments. This medicine may be used for other purposes; ask your health care provider or pharmacist if you have questions. What should I tell my health care provider before I take this medicine? They need to know if you have any of these conditions: -heart disease -heart failure -infection (especially a virus infection such as chickenpox, cold sores, or herpes) -lung or breathing  disease, like asthma -recent or ongoing radiation therapy -an unusual or allergic reaction to trastuzumab, benzyl alcohol, or other medications, foods, dyes, or preservatives -pregnant or trying to get pregnant -breast-feeding How should I use this medicine? This drug is given as an infusion into a vein. It is administered in a hospital or clinic by a specially trained health care professional. Talk to your pediatrician regarding the use of this medicine in children. This medicine is not approved for use in children. Overdosage: If you think you have taken too much of this medicine contact a poison control center or emergency room at once. NOTE: This medicine is only for you. Do not share this medicine with others. What if I miss a dose? It is important not to miss a dose. Call your doctor or health care professional if you are unable to keep an appointment. What may interact with this medicine? -cyclophosphamide -doxorubicin -warfarin This list may not describe all possible interactions. Give your health care provider  a list of all the medicines, herbs, non-prescription drugs, or dietary supplements you use. Also tell them if you smoke, drink alcohol, or use illegal drugs. Some items may interact with your medicine. What should I watch for while using this medicine? Visit your doctor for checks on your progress. Report any side effects. Continue your course of treatment even though you feel ill unless your doctor tells you to stop. Call your doctor or health care professional for advice if you get a fever, chills or sore throat, or other symptoms of a cold or flu. Do not treat yourself. Try to avoid being around people who are sick. You may experience fever, chills and shaking during your first infusion. These effects are usually mild and can be treated with other medicines. Report any side effects during the infusion to your health care professional. Fever and chills usually do not happen with  later infusions. What side effects may I notice from receiving this medicine? Side effects that you should report to your doctor or other health care professional as soon as possible: -breathing difficulties -chest pain or palpitations -cough -dizziness or fainting -fever or chills, sore throat -skin rash, itching or hives -swelling of the legs or ankles -unusually weak or tired Side effects that usually do not require medical attention (report to your doctor or other health care professional if they continue or are bothersome): -loss of appetite -headache -muscle aches -nausea This list may not describe all possible side effects. Call your doctor for medical advice about side effects. You may report side effects to FDA at 1-800-FDA-1088. Where should I keep my medicine? This drug is given in a hospital or clinic and will not be stored at home. NOTE: This sheet is a summary. It may not cover all possible information. If you have questions about this medicine, talk to your doctor, pharmacist, or health care provider.  2012, [employer]. (06/25/2009 1:43:15 PM)

## 2012-03-01 ENCOUNTER — Telehealth: Payer: Self-pay | Admitting: *Deleted

## 2012-03-01 ENCOUNTER — Ambulatory Visit (HOSPITAL_BASED_OUTPATIENT_CLINIC_OR_DEPARTMENT_OTHER): Payer: Medicaid Other

## 2012-03-01 VITALS — BP 124/68 | HR 96 | Temp 99.0°F

## 2012-03-01 DIAGNOSIS — C7951 Secondary malignant neoplasm of bone: Secondary | ICD-10-CM

## 2012-03-01 DIAGNOSIS — C50919 Malignant neoplasm of unspecified site of unspecified female breast: Secondary | ICD-10-CM

## 2012-03-01 DIAGNOSIS — C787 Secondary malignant neoplasm of liver and intrahepatic bile duct: Secondary | ICD-10-CM

## 2012-03-01 MED ORDER — PEGFILGRASTIM INJECTION 6 MG/0.6ML
6.0000 mg | Freq: Once | SUBCUTANEOUS | Status: AC
  Administered 2012-03-01: 6 mg via SUBCUTANEOUS
  Filled 2012-03-01: qty 0.6

## 2012-03-01 NOTE — Telephone Encounter (Signed)
Vicki Sanders here for Neulasta injection following 1st chemotherapy  Thc treatment.  She is not feeling well.  Having nausea and vomited one time this morning.  Not having any diarrhea.  Was eating without difficulty yesterday.  States that she took Zofran this morning and Ativan last night.  Encouraged her to continue to drink fluids and sip on chicken noodle soup for the nausea.  Talked about what to watch for and things to possibly expect for the Neulasta injection.  She knows to call if she doesn't get any better or have other problems or concerns.

## 2012-03-05 ENCOUNTER — Ambulatory Visit (HOSPITAL_BASED_OUTPATIENT_CLINIC_OR_DEPARTMENT_OTHER): Payer: Medicaid Other

## 2012-03-05 ENCOUNTER — Other Ambulatory Visit: Payer: Self-pay | Admitting: Medical Oncology

## 2012-03-05 ENCOUNTER — Other Ambulatory Visit: Payer: Self-pay | Admitting: Oncology

## 2012-03-05 VITALS — BP 98/62 | HR 108 | Temp 98.3°F

## 2012-03-05 DIAGNOSIS — C50919 Malignant neoplasm of unspecified site of unspecified female breast: Secondary | ICD-10-CM

## 2012-03-05 DIAGNOSIS — R638 Other symptoms and signs concerning food and fluid intake: Secondary | ICD-10-CM

## 2012-03-05 DIAGNOSIS — K2960 Other gastritis without bleeding: Secondary | ICD-10-CM

## 2012-03-05 DIAGNOSIS — R109 Unspecified abdominal pain: Secondary | ICD-10-CM

## 2012-03-05 DIAGNOSIS — C7951 Secondary malignant neoplasm of bone: Secondary | ICD-10-CM

## 2012-03-05 DIAGNOSIS — C7952 Secondary malignant neoplasm of bone marrow: Secondary | ICD-10-CM

## 2012-03-05 DIAGNOSIS — M549 Dorsalgia, unspecified: Secondary | ICD-10-CM

## 2012-03-05 DIAGNOSIS — Z5112 Encounter for antineoplastic immunotherapy: Secondary | ICD-10-CM

## 2012-03-05 LAB — COMPREHENSIVE METABOLIC PANEL
Albumin: 1.6 g/dL — ABNORMAL LOW (ref 3.5–5.2)
Alkaline Phosphatase: 748 U/L — ABNORMAL HIGH (ref 39–117)
BUN: 30 mg/dL — ABNORMAL HIGH (ref 6–23)
Glucose, Bld: 187 mg/dL — ABNORMAL HIGH (ref 70–99)
Potassium: 3.6 mEq/L (ref 3.5–5.3)

## 2012-03-05 LAB — CBC WITH DIFFERENTIAL/PLATELET
Basophils Absolute: 0 10*3/uL (ref 0.0–0.1)
Eosinophils Absolute: 0 10*3/uL (ref 0.0–0.5)
HCT: 31.8 % — ABNORMAL LOW (ref 34.8–46.6)
HGB: 10.5 g/dL — ABNORMAL LOW (ref 11.6–15.9)
LYMPH%: 48.4 % (ref 14.0–49.7)
MCV: 87.1 fL (ref 79.5–101.0)
MONO%: 12.9 % (ref 0.0–14.0)
NEUT#: 0.4 10*3/uL — CL (ref 1.5–6.5)
NEUT%: 33.9 % — ABNORMAL LOW (ref 38.4–76.8)
Platelets: 103 10*3/uL — ABNORMAL LOW (ref 145–400)
RDW: 15.4 % — ABNORMAL HIGH (ref 11.2–14.5)

## 2012-03-05 MED ORDER — SODIUM CHLORIDE 0.9 % IV SOLN: INTRAVENOUS | Status: DC

## 2012-03-05 MED ORDER — SODIUM CHLORIDE 0.9 % IV SOLN
Freq: Once | INTRAVENOUS | Status: AC
  Administered 2012-03-05: 15:00:00 via INTRAVENOUS

## 2012-03-05 MED ORDER — SUCRALFATE 1 GM/10ML PO SUSP: 1.0000 g | Freq: Four times a day (QID) | ORAL | Status: AC

## 2012-03-05 MED ORDER — MORPHINE SULFATE 4 MG/ML IJ SOLN
2.0000 mg | Freq: Once | INTRAMUSCULAR | Status: AC
  Administered 2012-03-05: 2 mg via INTRAVENOUS

## 2012-03-05 MED ORDER — FENTANYL 100 MCG/HR TD PT72: 1.0000 | MEDICATED_PATCH | TRANSDERMAL | Status: DC

## 2012-03-05 MED ORDER — ACETAMINOPHEN 325 MG PO TABS
650.0000 mg | ORAL_TABLET | Freq: Once | ORAL | Status: AC
  Administered 2012-03-05: 650 mg via ORAL

## 2012-03-05 MED ORDER — SODIUM CHLORIDE 0.9 % IV SOLN
2.0000 mg/kg | Freq: Once | INTRAVENOUS | Status: AC
  Administered 2012-03-05: 189 mg via INTRAVENOUS
  Filled 2012-03-05: qty 9

## 2012-03-05 MED ORDER — DIPHENHYDRAMINE HCL 25 MG PO CAPS
50.0000 mg | ORAL_CAPSULE | Freq: Once | ORAL | Status: AC
  Administered 2012-03-05: 50 mg via ORAL

## 2012-03-05 MED ORDER — SODIUM CHLORIDE 0.9 % IJ SOLN
10.0000 mL | INTRAMUSCULAR | Status: DC | PRN
  Administered 2012-03-05: 10 mL
  Filled 2012-03-05: qty 10

## 2012-03-05 MED ORDER — HEPARIN SOD (PORK) LOCK FLUSH 100 UNIT/ML IV SOLN
500.0000 [IU] | Freq: Once | INTRAVENOUS | Status: AC | PRN
  Administered 2012-03-05: 500 [IU]
  Filled 2012-03-05: qty 5

## 2012-03-05 MED ORDER — CIPROFLOXACIN HCL 500 MG PO TABS: 500.0000 mg | ORAL_TABLET | Freq: Two times a day (BID) | ORAL | Status: AC

## 2012-03-05 NOTE — Patient Instructions (Addendum)
[facility] Discharge Instructions for Patients Receiving Chemotherapy  Today you received the following chemotherapy agents : Herceptin.  To help prevent nausea and vomiting after your treatment, we encourage you to take your nausea medication- Zofran (ondansetron), Compazine (prochlorperazine) and Ativan (lorazepam) as directed for nausea.   If you develop nausea and vomiting that is not controlled by your nausea medication, call the clinic. If it is after clinic hours your family physician or the after hours number for the clinic or go to the Emergency Department.   BELOW ARE SYMPTOMS THAT SHOULD BE REPORTED IMMEDIATELY:  *FEVER GREATER THAN 100.5 F  *CHILLS WITH OR WITHOUT FEVER  NAUSEA AND VOMITING THAT IS NOT CONTROLLED WITH YOUR NAUSEA MEDICATION  *UNUSUAL SHORTNESS OF BREATH  *UNUSUAL BRUISING OR BLEEDING  TENDERNESS IN MOUTH AND THROAT WITH OR WITHOUT PRESENCE OF ULCERS  *URINARY PROBLEMS  *BOWEL PROBLEMS  UNUSUAL RASH Items with * indicate a potential emergency and should be followed up as soon as possible.  Feel free to call the clinic you have any questions or concerns. The clinic phone number is [phone].   I have been informed and understand all the instructions given to me. I know to contact the clinic, my physician, or go to the Emergency Department if any problems should occur. I do not have any questions at this time, but understand that I may call the clinic during office hours   should I have any questions or need assistance in obtaining follow up care.    __________________________________________  _____________  __________ Signature of Patient or Authorized Representative            Date                   Time    __________________________________________ Nurse's Signature

## 2012-03-06 ENCOUNTER — Inpatient Hospital Stay (HOSPITAL_COMMUNITY): Payer: Medicaid Other

## 2012-03-06 ENCOUNTER — Inpatient Hospital Stay (HOSPITAL_COMMUNITY)
Admission: EM | Admit: 2012-03-06 | Discharge: 2012-03-20 | DRG: 597 | Disposition: A | Discharge status: Skilled Nursing Facility | Payer: Medicaid Other | Attending: Internal Medicine | Admitting: Internal Medicine

## 2012-03-06 ENCOUNTER — Other Ambulatory Visit: Payer: Self-pay

## 2012-03-06 ENCOUNTER — Other Ambulatory Visit: Payer: Self-pay | Admitting: Certified Registered Nurse Anesthetist

## 2012-03-06 ENCOUNTER — Emergency Department (HOSPITAL_COMMUNITY): Payer: Medicaid Other

## 2012-03-06 ENCOUNTER — Encounter (HOSPITAL_COMMUNITY): Payer: Self-pay | Admitting: Emergency Medicine

## 2012-03-06 ENCOUNTER — Ambulatory Visit: Payer: Medicaid Other | Admitting: Oncology

## 2012-03-06 DIAGNOSIS — R627 Adult failure to thrive: Secondary | ICD-10-CM | POA: Diagnosis present

## 2012-03-06 DIAGNOSIS — R109 Unspecified abdominal pain: Secondary | ICD-10-CM | POA: Diagnosis present

## 2012-03-06 DIAGNOSIS — R7402 Elevation of levels of lactic acid dehydrogenase (LDH): Secondary | ICD-10-CM | POA: Diagnosis present

## 2012-03-06 DIAGNOSIS — M549 Dorsalgia, unspecified: Secondary | ICD-10-CM

## 2012-03-06 DIAGNOSIS — D62 Acute posthemorrhagic anemia: Secondary | ICD-10-CM | POA: Diagnosis present

## 2012-03-06 DIAGNOSIS — Z79899 Other long term (current) drug therapy: Secondary | ICD-10-CM

## 2012-03-06 DIAGNOSIS — K2960 Other gastritis without bleeding: Secondary | ICD-10-CM

## 2012-03-06 DIAGNOSIS — E43 Unspecified severe protein-calorie malnutrition: Secondary | ICD-10-CM | POA: Diagnosis present

## 2012-03-06 DIAGNOSIS — Z6835 Body mass index (BMI) 35.0-35.9, adult: Secondary | ICD-10-CM

## 2012-03-06 DIAGNOSIS — R112 Nausea with vomiting, unspecified: Secondary | ICD-10-CM | POA: Diagnosis present

## 2012-03-06 DIAGNOSIS — D709 Neutropenia, unspecified: Secondary | ICD-10-CM | POA: Diagnosis present

## 2012-03-06 DIAGNOSIS — C7951 Secondary malignant neoplasm of bone: Secondary | ICD-10-CM | POA: Diagnosis present

## 2012-03-06 DIAGNOSIS — F411 Generalized anxiety disorder: Secondary | ICD-10-CM | POA: Diagnosis present

## 2012-03-06 DIAGNOSIS — C787 Secondary malignant neoplasm of liver and intrahepatic bile duct: Secondary | ICD-10-CM | POA: Diagnosis present

## 2012-03-06 DIAGNOSIS — Z66 Do not resuscitate: Secondary | ICD-10-CM | POA: Diagnosis not present

## 2012-03-06 DIAGNOSIS — D61810 Antineoplastic chemotherapy induced pancytopenia: Secondary | ICD-10-CM | POA: Diagnosis present

## 2012-03-06 DIAGNOSIS — T451X5A Adverse effect of antineoplastic and immunosuppressive drugs, initial encounter: Secondary | ICD-10-CM | POA: Diagnosis present

## 2012-03-06 DIAGNOSIS — Z87891 Personal history of nicotine dependence: Secondary | ICD-10-CM

## 2012-03-06 DIAGNOSIS — Z9221 Personal history of antineoplastic chemotherapy: Secondary | ICD-10-CM

## 2012-03-06 DIAGNOSIS — R5381 Other malaise: Secondary | ICD-10-CM | POA: Diagnosis present

## 2012-03-06 DIAGNOSIS — C7952 Secondary malignant neoplasm of bone marrow: Secondary | ICD-10-CM | POA: Diagnosis present

## 2012-03-06 DIAGNOSIS — R1011 Right upper quadrant pain: Secondary | ICD-10-CM | POA: Diagnosis present

## 2012-03-06 DIAGNOSIS — G893 Neoplasm related pain (acute) (chronic): Secondary | ICD-10-CM | POA: Diagnosis present

## 2012-03-06 DIAGNOSIS — C50919 Malignant neoplasm of unspecified site of unspecified female breast: Principal | ICD-10-CM

## 2012-03-06 DIAGNOSIS — R040 Epistaxis: Secondary | ICD-10-CM | POA: Diagnosis not present

## 2012-03-06 DIAGNOSIS — R17 Unspecified jaundice: Secondary | ICD-10-CM | POA: Diagnosis present

## 2012-03-06 DIAGNOSIS — K1231 Oral mucositis (ulcerative) due to antineoplastic therapy: Secondary | ICD-10-CM | POA: Diagnosis not present

## 2012-03-06 DIAGNOSIS — E118 Type 2 diabetes mellitus with unspecified complications: Secondary | ICD-10-CM | POA: Diagnosis present

## 2012-03-06 DIAGNOSIS — R7401 Elevation of levels of liver transaminase levels: Secondary | ICD-10-CM | POA: Diagnosis present

## 2012-03-06 DIAGNOSIS — E1165 Type 2 diabetes mellitus with hyperglycemia: Secondary | ICD-10-CM

## 2012-03-06 DIAGNOSIS — R630 Anorexia: Secondary | ICD-10-CM

## 2012-03-06 DIAGNOSIS — I959 Hypotension, unspecified: Secondary | ICD-10-CM | POA: Diagnosis not present

## 2012-03-06 DIAGNOSIS — C773 Secondary and unspecified malignant neoplasm of axilla and upper limb lymph nodes: Secondary | ICD-10-CM | POA: Diagnosis present

## 2012-03-06 DIAGNOSIS — E871 Hypo-osmolality and hyponatremia: Secondary | ICD-10-CM | POA: Diagnosis present

## 2012-03-06 DIAGNOSIS — C801 Malignant (primary) neoplasm, unspecified: Secondary | ICD-10-CM

## 2012-03-06 DIAGNOSIS — R197 Diarrhea, unspecified: Secondary | ICD-10-CM | POA: Diagnosis present

## 2012-03-06 DIAGNOSIS — E669 Obesity, unspecified: Secondary | ICD-10-CM | POA: Diagnosis present

## 2012-03-06 DIAGNOSIS — R5081 Fever presenting with conditions classified elsewhere: Secondary | ICD-10-CM | POA: Diagnosis present

## 2012-03-06 DIAGNOSIS — D649 Anemia, unspecified: Secondary | ICD-10-CM

## 2012-03-06 DIAGNOSIS — IMO0002 Reserved for concepts with insufficient information to code with codable children: Secondary | ICD-10-CM | POA: Diagnosis present

## 2012-03-06 DIAGNOSIS — N390 Urinary tract infection, site not specified: Secondary | ICD-10-CM | POA: Diagnosis present

## 2012-03-06 DIAGNOSIS — N179 Acute kidney failure, unspecified: Secondary | ICD-10-CM | POA: Diagnosis present

## 2012-03-06 DIAGNOSIS — F419 Anxiety disorder, unspecified: Secondary | ICD-10-CM | POA: Diagnosis present

## 2012-03-06 DIAGNOSIS — R531 Weakness: Secondary | ICD-10-CM | POA: Diagnosis present

## 2012-03-06 LAB — URINE MICROSCOPIC-ADD ON

## 2012-03-06 LAB — CBC WITH DIFFERENTIAL/PLATELET
Eosinophils Absolute: 0 10*3/uL (ref 0.0–0.7)
Eosinophils Relative: 0 % (ref 0–5)
Lymphocytes Relative: 36 % (ref 12–46)
Lymphs Abs: 0.5 10*3/uL — ABNORMAL LOW (ref 0.7–4.0)
MCH: 28.8 pg (ref 26.0–34.0)
Myelocytes: 0 %
Neutro Abs: 0.4 10*3/uL — ABNORMAL LOW (ref 1.7–7.7)
Neutrophils Relative %: 25 % — ABNORMAL LOW (ref 43–77)
Platelets: 100 10*3/uL — ABNORMAL LOW (ref 150–400)
RBC: 3.58 MIL/uL — ABNORMAL LOW (ref 3.87–5.11)
WBC: 1.5 10*3/uL — ABNORMAL LOW (ref 4.0–10.5)
nRBC: 0 /100 WBC

## 2012-03-06 LAB — COMPREHENSIVE METABOLIC PANEL
ALT: 72 U/L — ABNORMAL HIGH (ref 0–35)
AST: 206 U/L — ABNORMAL HIGH (ref 0–37)
CO2: 22 mEq/L (ref 19–32)
Chloride: 95 mEq/L — ABNORMAL LOW (ref 96–112)
Creatinine, Ser: 1.09 mg/dL (ref 0.50–1.10)
GFR calc non Af Amer: 60 mL/min — ABNORMAL LOW (ref >90)
Sodium: 129 mEq/L — ABNORMAL LOW (ref 135–145)
Total Bilirubin: 16.2 mg/dL — ABNORMAL HIGH (ref 0.3–1.2)

## 2012-03-06 LAB — URINALYSIS, ROUTINE W REFLEX MICROSCOPIC
Glucose, UA: 100 mg/dL — AB
Hgb urine dipstick: NEGATIVE
Protein, ur: NEGATIVE mg/dL

## 2012-03-06 LAB — MAGNESIUM: Magnesium: 2.4 mg/dL (ref 1.5–2.5)

## 2012-03-06 LAB — PHOSPHORUS: Phosphorus: 3.3 mg/dL (ref 2.3–4.6)

## 2012-03-06 LAB — GLUCOSE, CAPILLARY

## 2012-03-06 MED ORDER — SODIUM CHLORIDE 0.9 % IV SOLN
INTRAVENOUS | Status: DC
  Administered 2012-03-07 – 2012-03-08 (×4): via INTRAVENOUS

## 2012-03-06 MED ORDER — LIDOCAINE-PRILOCAINE 2.5-2.5 % EX CREA
TOPICAL_CREAM | CUTANEOUS | Status: DC | PRN
  Filled 2012-03-06: qty 5

## 2012-03-06 MED ORDER — SUCRALFATE 1 GM/10ML PO SUSP
1.0000 g | Freq: Three times a day (TID) | ORAL | Status: DC
  Administered 2012-03-07 – 2012-03-08 (×7): 1 g via ORAL
  Filled 2012-03-06 (×10): qty 10

## 2012-03-06 MED ORDER — MORPHINE SULFATE 2 MG/ML IJ SOLN
1.0000 mg | INTRAMUSCULAR | Status: DC | PRN
  Administered 2012-03-09 – 2012-03-10 (×7): 1 mg via INTRAVENOUS
  Filled 2012-03-06 (×8): qty 1

## 2012-03-06 MED ORDER — CIPROFLOXACIN IN D5W 200 MG/100ML IV SOLN
200.0000 mg | Freq: Two times a day (BID) | INTRAVENOUS | Status: DC
  Filled 2012-03-06: qty 100

## 2012-03-06 MED ORDER — FAMOTIDINE 20 MG PO TABS
20.0000 mg | ORAL_TABLET | Freq: Every day | ORAL | Status: DC
  Administered 2012-03-06: 20 mg via ORAL
  Filled 2012-03-06: qty 1

## 2012-03-06 MED ORDER — CIPROFLOXACIN IN D5W 400 MG/200ML IV SOLN
400.0000 mg | Freq: Once | INTRAVENOUS | Status: AC
  Administered 2012-03-06: 400 mg via INTRAVENOUS
  Filled 2012-03-06: qty 200

## 2012-03-06 MED ORDER — ONDANSETRON HCL 4 MG/2ML IJ SOLN
4.0000 mg | Freq: Four times a day (QID) | INTRAMUSCULAR | Status: DC | PRN
  Administered 2012-03-07 – 2012-03-14 (×10): 4 mg via INTRAVENOUS
  Filled 2012-03-06 (×10): qty 2

## 2012-03-06 MED ORDER — ONDANSETRON HCL 4 MG PO TABS: 4.0000 mg | ORAL_TABLET | Freq: Four times a day (QID) | ORAL | Status: DC | PRN

## 2012-03-06 MED ORDER — ENOXAPARIN SODIUM 60 MG/0.6ML ~~LOC~~ SOLN
50.0000 mg | SUBCUTANEOUS | Status: DC
  Administered 2012-03-07 (×2): 50 mg via SUBCUTANEOUS
  Administered 2012-03-08: via SUBCUTANEOUS
  Administered 2012-03-09: 50 mg via SUBCUTANEOUS
  Filled 2012-03-06 (×6): qty 0.6

## 2012-03-06 MED ORDER — ONDANSETRON HCL 4 MG/2ML IJ SOLN
4.0000 mg | Freq: Once | INTRAMUSCULAR | Status: AC
  Administered 2012-03-06: 4 mg via INTRAVENOUS
  Filled 2012-03-06: qty 2

## 2012-03-06 MED ORDER — SODIUM CHLORIDE 0.9 % IV BOLUS (SEPSIS)
1000.0000 mL | Freq: Once | INTRAVENOUS | Status: AC
  Administered 2012-03-06: 1000 mL via INTRAVENOUS

## 2012-03-06 MED ORDER — HYDROCODONE-ACETAMINOPHEN 5-325 MG PO TABS
1.0000 | ORAL_TABLET | ORAL | Status: DC | PRN
  Administered 2012-03-07 (×2): 2 via ORAL
  Administered 2012-03-09 (×2): 1 via ORAL
  Filled 2012-03-06: qty 2
  Filled 2012-03-06: qty 1
  Filled 2012-03-06: qty 2
  Filled 2012-03-06: qty 1

## 2012-03-06 MED ORDER — ACETAMINOPHEN 325 MG PO TABS
650.0000 mg | ORAL_TABLET | Freq: Once | ORAL | Status: AC
  Administered 2012-03-06: 650 mg via ORAL
  Filled 2012-03-06: qty 2

## 2012-03-06 MED ORDER — FENTANYL 100 MCG/HR TD PT72
100.0000 ug | MEDICATED_PATCH | TRANSDERMAL | Status: DC
  Administered 2012-03-07 – 2012-03-09 (×3): 100 ug via TRANSDERMAL
  Filled 2012-03-06 (×2): qty 1

## 2012-03-06 MED ORDER — CIPROFLOXACIN HCL 500 MG PO TABS: 500.0000 mg | ORAL_TABLET | Freq: Two times a day (BID) | ORAL | Status: DC

## 2012-03-06 MED ORDER — PANTOPRAZOLE SODIUM 40 MG IV SOLR
40.0000 mg | INTRAVENOUS | Status: DC
  Administered 2012-03-07 (×2): 40 mg via INTRAVENOUS
  Filled 2012-03-06 (×3): qty 40

## 2012-03-06 MED ORDER — CIPROFLOXACIN IN D5W 400 MG/200ML IV SOLN
400.0000 mg | Freq: Two times a day (BID) | INTRAVENOUS | Status: DC
  Administered 2012-03-07 – 2012-03-08 (×3): 400 mg via INTRAVENOUS
  Filled 2012-03-06 (×3): qty 200

## 2012-03-06 NOTE — ED Notes (Addendum)
Pt states she hasn't had anything to since Friday. Her last snack was jello.  Pt state she's weak and can't get.  Reports caregiver being unable to take care of her, he's been unable to perform ADLs for pt.   Was diagnosed with stage IV breast cancer x2 mos.

## 2012-03-06 NOTE — ED Provider Notes (Signed)
History     CSN: [id number]  Arrival date & time 03/06/12  1345  3:26 PM HPI Patient reports she has stage 4 breast cancer. States she had her 2nd dose of chemo yesterday. Reports feeling extremely fatigued and weak. States also had a temp today. Patient is a 46 y.o. female presenting with fever. The history is provided by the patient.  Fever Primary symptoms of the febrile illness include fever and fatigue. Primary symptoms do not include headaches, cough, wheezing, shortness of breath, abdominal pain, nausea, vomiting, diarrhea, dysuria, altered mental status, myalgias or rash. The current episode started today. This is a new problem. The problem has been gradually worsening.  The fever began 3 to 5 days ago.  Risk factors for febrile illness include history of cancer.   Past Medical History  Diagnosis Date  . Diabetes mellitus   . Arthritis   . Cancer 01/23/2012    Past Surgical History  Procedure Date  . Shoulder arthrotomy   . Tonsillectomy   . Appendectomy   . Cesarean section     Family History  Problem Relation Age of Onset  . Cancer Mother 30    lung cancer  . Diabetes Father   . Diabetes Sister   . Cancer Maternal Aunt     bilateral cancers, mastectomy  . Cancer Paternal Grandmother     bone cancer    History  Substance Use Topics  . Smoking status: Former Smoker -- 1.0 packs/day    Quit date: 01/23/2012  . Smokeless tobacco: Never Used  . Alcohol Use: No    OB History    Grav Para Term Preterm Abortions TAB SAB Ect Mult Living                  Review of Systems  Constitutional: Positive for fever, chills and fatigue.  HENT: Negative for congestion, sore throat, rhinorrhea, neck pain and neck stiffness.   Respiratory: Negative for cough, shortness of breath and wheezing.   Cardiovascular: Negative for chest pain.  Gastrointestinal: Negative for nausea, vomiting, abdominal pain and diarrhea.  Genitourinary: Negative for dysuria, urgency, frequency,  hematuria, vaginal bleeding and vaginal discharge.  Musculoskeletal: Negative for myalgias and back pain.  Skin: Negative for rash.  Neurological: Positive for weakness. Negative for dizziness and headaches.  Psychiatric/Behavioral: Negative for altered mental status.  All other systems reviewed and are negative.    Allergies  Cephalosporins  Home Medications   Current Outpatient Rx  Name Route Sig Dispense Refill  . LIDOCAINE-PRILOCAINE 2.5-2.5 % EX CREA Topical Apply topically as needed. 30 g 6  . LORAZEPAM 0.5 MG PO TABS Oral Take 1 tablet (0.5 mg total) by mouth every 6 (six) hours as needed (Nausea or vomiting). 30 tablet 0  . ONDANSETRON HCL 8 MG PO TABS  Take 1 tablet two times a day starting the day after chemo for 3 days. Then take 1 tab two times a day as needed for nausea or vomiting. 30 tablet 1  . PRESCRIPTION MEDICATION  [facility]. Pt's last treatment was on 03-05-12 followed by Dr Eustace Pen.    Marland Kitchen RANITIDINE HCL 150 MG PO TABS Oral Take 150 mg by mouth 2 (two) times daily.    Marland Kitchen CIPROFLOXACIN HCL 500 MG PO TABS Oral Take 1 tablet (500 mg total) by mouth 2 (two) times daily. 20 tablet 0  . DEXAMETHASONE 4 MG PO TABS  Take 2 tablets two times a day the day before Taxotere. Then take  2 tabs two times a day starting the day after chemo for 3 days. 30 tablet 1  . FENTANYL 50 MCG/HR TD PT72 Transdermal Place 2 patches onto the skin every 3 (three) days. Pt place 2 patches every 72 hrs for 100 mcg dose    . PROCHLORPERAZINE MALEATE 10 MG PO TABS Oral Take 1 tablet (10 mg total) by mouth every 6 (six) hours as needed (Nausea or vomiting). 30 tablet 1  . PROCHLORPERAZINE 25 MG RE SUPP Rectal Place 1 suppository (25 mg total) rectally every 12 (twelve) hours as needed for nausea. 12 suppository 3  . SUCRALFATE 1 GM/10ML PO SUSP Oral Take 10 mLs (1 g total) by mouth 4 (four) times daily. 100 mL 0    BP 114/60  Pulse 113  Temp 101.2 F (38.4 C) (Rectal)  Resp 18  SpO2 96%  LMP  02/24/2012  Physical Exam  Vitals reviewed. Constitutional: She is oriented to person, place, and time. Vital signs are normal. She appears well-developed and well-nourished.  HENT:  Head: Normocephalic and atraumatic.  Mouth/Throat: Oropharynx is clear and moist. No oropharyngeal exudate.  Eyes: Conjunctivae are normal. Pupils are equal, round, and reactive to light.  Neck: Normal range of motion. Neck supple.  Cardiovascular: Normal rate, regular rhythm and normal heart sounds.  Exam reveals no friction rub.   No murmur heard. Pulmonary/Chest: Effort normal and breath sounds normal. She has no wheezes. She has no rhonchi. She has no rales. She exhibits no tenderness.  Abdominal: Soft. Bowel sounds are normal. She exhibits no distension and no mass. There is no tenderness. There is no rebound and no guarding.  Musculoskeletal: Normal range of motion.  Neurological: She is alert and oriented to person, place, and time. Coordination normal.  Skin: Skin is warm and dry. No rash noted. No erythema. No pallor.       Jaundice    ED Course  Procedures   Results for orders placed during the hospital encounter of 03/06/12  GLUCOSE, CAPILLARY      Component Value Range   Glucose-Capillary 199 (*) 70 - 99 mg/dL   Comment 1 Documented in Chart     Comment 2 Notify RN    CBC WITH DIFFERENTIAL      Component Value Range   WBC 1.5 (*) 4.0 - 10.5 K/uL   RBC 3.58 (*) 3.87 - 5.11 MIL/uL   Hemoglobin 10.3 (*) 12.0 - 15.0 g/dL   HCT 16.1 (*) 09.6 - 04.5 %   MCV 88.5  78.0 - 100.0 fL   MCH 28.8  26.0 - 34.0 pg   MCHC 32.5  30.0 - 36.0 g/dL   RDW 40.9  81.1 - 91.4 %   Platelets 100 (*) 150 - 400 K/uL   Neutrophils Relative 25 (*) 43 - 77 %   Lymphocytes Relative 36  12 - 46 %   Monocytes Relative 37 (*) 3 - 12 %   Eosinophils Relative 0  0 - 5 %   Basophils Relative 2 (*) 0 - 1 %   Band Neutrophils 0  0 - 10 %   Metamyelocytes Relative 0     Myelocytes 0     Promyelocytes Absolute 0      Blasts 0     nRBC 0  0 /100 WBC   Neutro Abs 0.4 (*) 1.7 - 7.7 K/uL   Lymphs Abs 0.5 (*) 0.7 - 4.0 K/uL   Monocytes Absolute 0.6  0.1 - 1.0 K/uL   Eosinophils  Absolute 0.0  0.0 - 0.7 K/uL   Basophils Absolute 0.0  0.0 - 0.1 K/uL   RBC Morphology POLYCHROMASIA PRESENT     WBC Morphology GIANT PLATELETS SEEN     Smear Review LARGE PLATELETS PRESENT    COMPREHENSIVE METABOLIC PANEL      Component Value Range   Sodium 129 (*) 135 - 145 mEq/L   Potassium 4.1  3.5 - 5.1 mEq/L   Chloride 95 (*) 96 - 112 mEq/L   CO2 22  19 - 32 mEq/L   Glucose, Bld 197 (*) 70 - 99 mg/dL   BUN 32 (*) 6 - 23 mg/dL   Creatinine, Ser 9.60  0.50 - 1.10 mg/dL   Calcium 7.8 (*) 8.4 - 10.5 mg/dL   Total Protein 4.9 (*) 6.0 - 8.3 g/dL   Albumin 1.6 (*) 3.5 - 5.2 g/dL   AST 454 (*) 0 - 37 U/L   ALT 72 (*) 0 - 35 U/L   Alkaline Phosphatase 650 (*) 39 - 117 U/L   Total Bilirubin 16.2 (*) 0.3 - 1.2 mg/dL   GFR calc non Af Amer 60 (*) >90 mL/min   GFR calc Af Amer 70 (*) >90 mL/min  LIPASE, BLOOD      Component Value Range   Lipase 8 (*) 11 - 59 U/L  URINALYSIS, ROUTINE W REFLEX MICROSCOPIC      Component Value Range   Color, Urine ORANGE (*) YELLOW   APPearance CLOUDY (*) CLEAR   Specific Gravity, Urine 1.020  1.005 - 1.030   pH 5.0  5.0 - 8.0   Glucose, UA 100 (*) NEGATIVE mg/dL   Hgb urine dipstick NEGATIVE  NEGATIVE   Bilirubin Urine LARGE (*) NEGATIVE   Ketones, ur TRACE (*) NEGATIVE mg/dL   Protein, ur NEGATIVE  NEGATIVE mg/dL   Urobilinogen, UA 0.2  0.0 - 1.0 mg/dL   Nitrite POSITIVE (*) NEGATIVE   Leukocytes, UA SMALL (*) NEGATIVE  PATHOLOGIST SMEAR REVIEW      Component Value Range   Tech Review Reviewed By Havery Moros, M.D.    URINE MICROSCOPIC-ADD ON      Component Value Range   Squamous Epithelial / LPF FEW (*) RARE   WBC, UA 3-6  <3 WBC/hpf   Bacteria, UA FEW (*) RARE   Urine-Other MUCOUS PRESENT     Dg Chest 2 View  03/06/2012  *RADIOLOGY REPORT*  Clinical Data: Weakness. History of  breast cancer.  CHEST - 2 VIEW  Comparison: None.  Findings: The heart size is normal.  Left IJ Port-A-Cath is in place.  The tip is at the cavoatrial junction.  The lungs are clear.  The visualized soft tissues and bony thorax are unremarkable.  IMPRESSION:  1.  Left IJ Port-A-Cath is in satisfactory and stable position. 2.  Low lung volumes. 3.  No acute cardiopulmonary disease.  Original Report Authenticated By: Jamesetta Orleans. MATTERN, M.D.    MDM   7:29 PM Spoke with Dr. Izola Price, [facility]. She will come see patient for admission of UTI and Neutropenic fever. Discussed with pt and family who agree on plan      Thomasene Lot, Cordelia Poche 03/06/12 1936

## 2012-03-06 NOTE — ED Notes (Signed)
Patient transported to X-ray 

## 2012-03-06 NOTE — H&P (Addendum)
[facility] History and Physical  Vicki Sanders ZOX:[id number] DOB: Feb 10, 1966 DOA: 03/06/2012  Referring physician: ED doctor PCP: Sheila Oats, MD   Chief Complaint: fatigue  HPI:  Pt is 46 yo female who has stage 4 breast cancer, metastatic to liver, lymph nodes, lung, bones, and follows with Dr. Welton Flakes (started chemotherapy last week) and also says she has had her 2nd dose of chemo yesterday. She reports feeling tired and fatigued, with subjective fevers, chills, poor oral intake, mild dysuria and urinary frequency, generalized dull and constant abdominal pain mostly in the right upper quadrant area. She denies any chest pain or shortness of breath, no other urinary concerns. She does reports poor oral intake and nausea. Symptoms started 3 days prior to admission and have been getting progressively worse. Review of EPIC record shows following:  PET SCAN DONE 01/2012: IMPRESSION:  1. Two hypermetabolic masses in the posterior right breast consistent with primary breast carcinoma. Hypermetabolic subcutaneous tissue on the right may represent inflammatory carcinoma.  2. Local right axillary nodal metastasis.  3. Hypermetabolic internal mammary nodal metastasis on the right.  4. Hepatic metastasis with a large tumor burden within the left and right hepatic lobe. These lesions are intensely hypermetabolic.  5. Periportal nodal metastasis.  6. Widespread hypermetabolic skeletal metastasis involving the axillary and appendicular skeleton including the right skull base. Consider brain MRI.  7. Ill-defined pulmonary nodules without associated metabolic activity.  CT ABDOMEN and PELVIS 01/2012 DONE: IMPRESSION:  1. Multiple liver lesions worrisome for metastatic disease.  2. Enlarged retroperitoneal nodes in the upper abdomen.  3. Multiple lytic bone lesions worrisome for metastatic disease.   Review of Systems:  Constitutional: Positive for fever, chills and malaise/fatigue. Negative  for diaphoresis.  HENT: Negative for hearing loss, ear pain, nosebleeds, congestion, sore throat, neck pain, tinnitus and ear discharge.   Eyes: Negative for blurred vision, double vision, photophobia, pain, discharge and redness.  Respiratory: Negative for cough, hemoptysis, sputum production, shortness of breath, wheezing and stridor.   Cardiovascular: Negative for chest pain, palpitations, orthopnea, claudication and leg swelling.  Gastrointestinal: Positive for nausea, and abdominal pain,  Heartburn. Negative for constipation, blood in stool and melena.  Genitourinary: Positive for dysuria, urgency. Negative for frequency, hematuria and flank pain.  Musculoskeletal: Negative for myalgias. Positive for back pain. Negative for joint pain and falls.  Skin: Negative for itching and rash.  Neurological: Negative for dizziness and weakness. Negative for tingling, tremors, sensory change, speech change, focal weakness, loss of consciousness and headaches.  Endo/Heme/Allergies: Negative for environmental allergies and polydipsia. Does not bruise/bleed easily.  Psychiatric/Behavioral: Negative for suicidal ideas. The patient is not nervous/anxious.      Past Medical History  Diagnosis Date  . Diabetes mellitus   . Arthritis   . Cancer 01/23/2012   Past Surgical History  Procedure Date  . Shoulder arthrotomy   . Tonsillectomy   . Appendectomy   . Cesarean section    Social History:  reports that she quit smoking about 6 weeks ago. She has never used smokeless tobacco. She reports that she does not drink alcohol or use illicit drugs.  Allergies  Allergen Reactions  . Cephalosporins Hives and Itching    Family History  Problem Relation Age of Onset  . Cancer Mother 23    lung cancer  . Diabetes Father   . Diabetes Sister   . Cancer Maternal Aunt     bilateral cancers, mastectomy  . Cancer Paternal Grandmother     bone cancer  Prior to Admission medications   Medication Sig  Start Date End Date Taking? Authorizing Provider  lidocaine-prilocaine (EMLA) cream Apply topically as needed. 02/20/12 02/19/13 Yes Victorino December, MD  LORazepam (ATIVAN) 0.5 MG tablet Take 1 tablet (0.5 mg total) by mouth every 6 (six) hours as needed (Nausea or vomiting). 02/20/12 08/18/12 Yes Victorino December, MD  ondansetron (ZOFRAN) 8 MG tablet Take 1 tablet two times a day starting the day after chemo for 3 days. Then take 1 tab two times a day as needed for nausea or vomiting. 02/20/12 02/19/13 Yes Victorino December, MD  PRESCRIPTION MEDICATION [facility]. Pt's last treatment was on 03-05-12 followed by Dr Eustace Pen.   Yes Historical Provider, MD  ranitidine (ZANTAC) 150 MG tablet Take 150 mg by mouth 2 (two) times daily.   Yes Historical Provider, MD  ciprofloxacin (CIPRO) 500 MG tablet Take 1 tablet (500 mg total) by mouth 2 (two) times daily. 03/05/12 03/15/12  Victorino December, MD  dexamethasone (DECADRON) 4 MG tablet Take 2 tablets two times a day the day before Taxotere. Then take 2 tabs two times a day starting the day after chemo for 3 days. 02/20/12 02/19/13  Victorino December, MD  fentaNYL (DURAGESIC - DOSED MCG/HR) 50 MCG/HR Place 2 patches onto the skin every 3 (three) days. Pt place 2 patches every 72 hrs for 100 mcg dose 02/20/12 03/21/12  Victorino December, MD  prochlorperazine (COMPAZINE) 10 MG tablet Take 1 tablet (10 mg total) by mouth every 6 (six) hours as needed (Nausea or vomiting). 02/20/12 02/19/13  Victorino December, MD  prochlorperazine (COMPAZINE) 25 MG suppository Place 1 suppository (25 mg total) rectally every 12 (twelve) hours as needed for nausea. 02/20/12 02/19/13  Victorino December, MD  sucralfate (CARAFATE) 1 GM/10ML suspension Take 10 mLs (1 g total) by mouth 4 (four) times daily. 03/05/12 04/04/12  Victorino December, MD   Physical Exam: Filed Vitals:   03/06/12 1409 03/06/12 1420 03/06/12 1600  BP: 114/60  107/55  Pulse: 113  110  Temp: 100.5 F (38.1 C) 101.2 F (38.4 C) 99 F (37.2 C)    TempSrc: Oral Rectal Oral  Resp: 18  18  SpO2: 96%  97%   Physical Exam  Constitutional: Appears sick, skin jaundiced, in mild distress due to pain. Eyes: EOM are normal. Pupils are equal, round, and reactive to light. Right eye exhibits no discharge. Left eye exhibits no discharge. Positive for scleral jaundice. Neck: Normal ROM. Neck supple. No JVD. No tracheal deviation. No thyromegaly.  Cardiovascular: Tachycardic, regular rhythm, normal heart sounds and intact distal pulses.  Exam reveals no gallop and no friction rub. No murmur heard. Pulmonary/Chest: Effort normal and breath sounds normal. No stridor. No respiratory distress. No wheezes, no rales.  Abdominal: Soft. Bowel sounds are normal. No distension and no mass, tenderness in suprapubic area and right upper quadrant area. No rebound and no guarding.  Musculoskeletal: Normal range of motion. No edema and no tenderness.  Lymphadenopathy: No lymphadenopathy noted, cervical, inguinal. Neurological: Alert. Normal reflexes. No cranial nerve deficit. Normal muscle tone. Coordination normal.  Skin: Skin is warm and dry, jaundiced Psychiatric: Normal mood and affect. Behavior is normal. Judgment and thought content normal.   Labs on Admission:  Basic Metabolic Panel:  Lab 03/06/12 1191 03/05/12 1533  NA 129* 129*  K 4.1 3.6  CL 95* 95*  CO2 22 24  GLUCOSE 197* 187*  BUN 32* 30*  CREATININE  1.09 0.97  CALCIUM 7.8* 8.0*  MG -- --  PHOS -- --   Liver Function Tests:  Lab 03/06/12 1535 03/05/12 1533  AST 206* 233*  ALT 72* 81*  ALKPHOS 650* 748*  BILITOT 16.2* 16.0*  PROT 4.9* 5.0*  ALBUMIN 1.6* 1.6*    Lab 03/06/12 1535  LIPASE 8*  AMYLASE --   CBC:  Lab 03/06/12 1535 03/05/12 1533  WBC 1.5* 1.2*  NEUTROABS 0.4* 0.4*  HGB 10.3* 10.5*  HCT 31.7* 31.8*  MCV 88.5 87.1  PLT 100* 103*    Lab 03/06/12 1413  GLUCAP 199*    Radiological Exams on Admission: Dg Chest 2 View  03/06/2012  *RADIOLOGY REPORT*   Clinical Data: Weakness. History of breast cancer.  CHEST - 2 VIEW  Comparison: None.  Findings: The heart size is normal.  Left IJ Port-A-Cath is in place.  The tip is at the cavoatrial junction.  The lungs are clear.  The visualized soft tissues and bony thorax are unremarkable.  IMPRESSION:  1.  Left IJ Port-A-Cath is in satisfactory and stable position. 2.  Low lung volumes. 3.  No acute cardiopulmonary disease.  Original Report Authenticated By: Jamesetta Orleans. MATTERN, M.D.   EKG: sinus tachycardia  Assessment/Plan  Fever, in the setting of neutropenia  - unclear etiology but likely related to UTI - will admit the patient to medical floor for further evaluation - will treat for UTI as UA and symptoms are suggestive of it, start Ciprofloxacin 400 mg IV BID - urine culture pending - blood cultures ordered as well given neutropenia  UTI - will treat as above, Ciprofloxacin 400 mg IV BID - urine culture pending  Metastatic breast cancer to the liver, bones, lymph nodes - will obtain Abdominal US since pt now has apparently more tenderness in abdomen compared to before - please note that abdominal CT scan that showed liver metastasis was done in May when pt was not as jaundiced and had no significant abdominal pain - per Dr. Welton Flakes jaundice is improving but pt is diffusely jaundiced - GI consult obtained, discussed with Dr. Bosie Clos  and he said that if abdominal US shows dilation than stenting would be recommended - he said to call him AM with results and he will decide if further evaluation will be warrented  Pancytopenia - likely secondary to malignancy or recent chemotherapy - blood cultures ordered  - continue Cipro for now and may need to broaden coverage if no clinical response noted over 24 hours - CBC actually looks rather stable since yesterday   Transaminitis - likely secondary to liver metastatic disease process - will obtain abdominal US and will check CMET in AM - it  looks like the numbers are slightly trending down compared to yesterday  Hyponatremia - secondary to pre renal etiology - will hydrate with NS - CMET in AM  Hyperglycemia - will check A1C  Code Status: Full Family Communication: Pt at bedside Disposition Plan: To medical floor  Debbora Presto, MD  [facility] Pager [phone]  If 7PM-7AM, please contact night-coverage [email or web address] Password [facility] 03/06/2012, 7:41 PM

## 2012-03-06 NOTE — ED Provider Notes (Signed)
Medical screening examination/treatment/procedure(s) were performed by non-physician practitioner and as supervising physician I was immediately available for consultation/collaboration.  Marcina Kinnison R. Isaiha Asare, MD 03/06/12 2355 

## 2012-03-06 NOTE — ED Notes (Signed)
Family at bedside. 

## 2012-03-06 NOTE — ED Notes (Signed)
[id number]<QM> Expected date:<BR> Expected time:<BR> Means of arrival:Ambulance<BR> Comments:<BR> M41- generalized illness, stage IV Ca

## 2012-03-06 NOTE — ED Notes (Signed)
Per EMS- pt c/o weakness x1 day and dehydration.  Pt has hx of stage 4 breast cancer, was seen at cancer center yesterday for chemotherapy session and is schedule for an appt today.  Pt picked up from home, alert and oriented.  Also has hx of DM.

## 2012-03-07 DIAGNOSIS — N390 Urinary tract infection, site not specified: Secondary | ICD-10-CM | POA: Diagnosis present

## 2012-03-07 DIAGNOSIS — E43 Unspecified severe protein-calorie malnutrition: Secondary | ICD-10-CM | POA: Diagnosis present

## 2012-03-07 DIAGNOSIS — D709 Neutropenia, unspecified: Secondary | ICD-10-CM | POA: Diagnosis present

## 2012-03-07 DIAGNOSIS — E1165 Type 2 diabetes mellitus with hyperglycemia: Secondary | ICD-10-CM | POA: Diagnosis present

## 2012-03-07 DIAGNOSIS — D61810 Antineoplastic chemotherapy induced pancytopenia: Secondary | ICD-10-CM | POA: Diagnosis present

## 2012-03-07 DIAGNOSIS — E871 Hypo-osmolality and hyponatremia: Secondary | ICD-10-CM | POA: Diagnosis present

## 2012-03-07 DIAGNOSIS — N179 Acute kidney failure, unspecified: Secondary | ICD-10-CM | POA: Diagnosis present

## 2012-03-07 LAB — COMPREHENSIVE METABOLIC PANEL WITH GFR
ALT: 64 U/L — ABNORMAL HIGH (ref 0–35)
AST: 170 U/L — ABNORMAL HIGH (ref 0–37)
Albumin: 1.4 g/dL — ABNORMAL LOW (ref 3.5–5.2)
Alkaline Phosphatase: 507 U/L — ABNORMAL HIGH (ref 39–117)
BUN: 34 mg/dL — ABNORMAL HIGH (ref 6–23)
CO2: 21 meq/L (ref 19–32)
Calcium: 7.5 mg/dL — ABNORMAL LOW (ref 8.4–10.5)
Chloride: 97 meq/L (ref 96–112)
Creatinine, Ser: 1.2 mg/dL — ABNORMAL HIGH (ref 0.50–1.10)
GFR calc Af Amer: 62 mL/min — ABNORMAL LOW
GFR calc non Af Amer: 54 mL/min — ABNORMAL LOW
Glucose, Bld: 186 mg/dL — ABNORMAL HIGH (ref 70–99)
Potassium: 4 meq/L (ref 3.5–5.1)
Sodium: 130 meq/L — ABNORMAL LOW (ref 135–145)
Total Bilirubin: 14.7 mg/dL — ABNORMAL HIGH (ref 0.3–1.2)
Total Protein: 4.6 g/dL — ABNORMAL LOW (ref 6.0–8.3)

## 2012-03-07 LAB — GLUCOSE, CAPILLARY
Glucose-Capillary: 180 mg/dL — ABNORMAL HIGH (ref 70–99)
Glucose-Capillary: 153 mg/dL — ABNORMAL HIGH (ref 70–99)

## 2012-03-07 LAB — TSH: TSH: 1.4 u[IU]/mL (ref 0.350–4.500)

## 2012-03-07 LAB — CBC
MCV: 88.4 fL (ref 78.0–100.0)
Platelets: 87 10*3/uL — ABNORMAL LOW (ref 150–400)
RBC: 3.19 MIL/uL — ABNORMAL LOW (ref 3.87–5.11)
WBC: 2.4 10*3/uL — ABNORMAL LOW (ref 4.0–10.5)

## 2012-03-07 LAB — CLOSTRIDIUM DIFFICILE BY PCR: Toxigenic C. Difficile by PCR: NEGATIVE

## 2012-03-07 LAB — HEMOGLOBIN A1C: Hgb A1c MFr Bld: 10.9 % — ABNORMAL HIGH (ref <5.7)

## 2012-03-07 MED ORDER — INSULIN ASPART 100 UNIT/ML ~~LOC~~ SOLN
0.0000 [IU] | Freq: Three times a day (TID) | SUBCUTANEOUS | Status: DC
  Administered 2012-03-07 (×2): 3 [IU] via SUBCUTANEOUS
  Administered 2012-03-08 – 2012-03-11 (×3): 2 [IU] via SUBCUTANEOUS
  Administered 2012-03-12: 3 [IU] via SUBCUTANEOUS
  Administered 2012-03-12: 2 [IU] via SUBCUTANEOUS
  Administered 2012-03-12 – 2012-03-13 (×4): 3 [IU] via SUBCUTANEOUS
  Administered 2012-03-14: 2 [IU] via SUBCUTANEOUS
  Administered 2012-03-14: 3 [IU] via SUBCUTANEOUS
  Administered 2012-03-14: 2 [IU] via SUBCUTANEOUS
  Administered 2012-03-15: 5 [IU] via SUBCUTANEOUS
  Administered 2012-03-15: 3 [IU] via SUBCUTANEOUS
  Administered 2012-03-15: 2 [IU] via SUBCUTANEOUS
  Administered 2012-03-16: 3 [IU] via SUBCUTANEOUS
  Administered 2012-03-16: 2 [IU] via SUBCUTANEOUS
  Administered 2012-03-16 – 2012-03-18 (×5): 3 [IU] via SUBCUTANEOUS
  Administered 2012-03-18: 8 [IU] via SUBCUTANEOUS
  Administered 2012-03-18: 5 [IU] via SUBCUTANEOUS
  Administered 2012-03-19 (×2): 3 [IU] via SUBCUTANEOUS
  Administered 2012-03-19: 2 [IU] via SUBCUTANEOUS
  Administered 2012-03-20: 5 [IU] via SUBCUTANEOUS
  Administered 2012-03-20: 3 [IU] via SUBCUTANEOUS

## 2012-03-07 MED ORDER — METRONIDAZOLE IN NACL 5-0.79 MG/ML-% IV SOLN
500.0000 mg | Freq: Three times a day (TID) | INTRAVENOUS | Status: DC
  Administered 2012-03-07 – 2012-03-08 (×3): 500 mg via INTRAVENOUS
  Filled 2012-03-07 (×3): qty 100

## 2012-03-07 MED ORDER — INSULIN ASPART 100 UNIT/ML ~~LOC~~ SOLN
0.0000 [IU] | Freq: Every day | SUBCUTANEOUS | Status: DC
  Administered 2012-03-15 – 2012-03-17 (×2): 2 [IU] via SUBCUTANEOUS

## 2012-03-07 NOTE — Progress Notes (Signed)
PROGRESS NOTE  Jennipher Weatherholtz ZOX:[id number] DOB: [birth year] DOA: 03/06/2012 PCP: Sheila Oats, MD Oncologist: Dr. Drue Second  Brief narrative: Mrs. Kinkead is a 46 year old female with a past medical history of stage IV breast cancer, widely metastatic, who began chemotherapy last week with last dose given 03/05/2012 presented to the hospital on 03/06/2012 a chief complaint of fatigue, poor oral intake, and right upper quadrant abdominal pain. Upon initial evaluation emergency department, the patient was found to have transaminitis. An abdominal ultrasound was done which showed a normal bile duct.  Interim History: Stable overnight.   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 First chemotherapy with Taxotere and carboplatinum given 02/29/2012 with subsequent dose given 03/05/2012.  Also on Herceptin.  Dietitian, physical therapy, and occupational therapy evaluations requested. Active Problems:  Neutropenic fever  Unclear etiology, but on empiric Cipro for suspected urinary tract infection.  Blood and urine cultures sent, followup results.  Stool for Clostridium difficile sent, followup results.  Ongoing low-grade fever noted.  Urinary tract infection  Urinalysis reviewed and is positive for nitrites, 3-6 white blood cells, and a few bacteria.  Urine cultures sent, results pending.  On empiric Cipro.  Pancytopenia due to chemotherapy  White blood cell count improving.  Platelets and hemoglobin relatively stable, but trending down.  Continue to monitor closely  Transaminitis likely due to liver metastasis  Liver ultrasound consistent with metastatic disease.  No evidence of common bile duct obstruction. No indication for stenting at this time.  Hyponatremia  May be related to dehydration versus SIADH in the setting of known  malignancy.  Improved slightly 129---> 130 with IV fluids.  DM (diabetes mellitus), type 2, uncontrolled  Hemoglobin A1c indicative of poorly controlled diabetes.  The patient states she's been on diabetes medicines in the past, but stopped these due to inability to afford them.  Place on moderate scale sliding scale insulin.  Diabetes coordinator consultation.  Change diet to carbohydrate modified.  Acute kidney injury  Baseline creatinine 0.63 on 02/20/2012. Current creatinine elevated over her usual baseline values, and likely due to a prerenal etiology.  Hydrate and monitor creatinine.  Protein calorie malnutrition  Poor protein stores in the setting of decreased oral intake secondary to cancer.  Dietitian consultation requested.  Code Status: Full  Family Communication: None at bedside. Disposition Plan: Home when stable  Medical Consultants:  None  Other consultants:  Diabetes coordinator  Dietitian  Physical therapy  Antibiotics:  Cipro 03/06/2012--->   Subjective  Mrs. Bardwell continues to have lower abdominal pain and abdominal bloating. She has a dry mouth but no frank dysphasia or oral discomfort. She has nausea but no vomiting. She has moved her bowels 4 times over the past 24 hours. She does not know if there has been blood in her bowel movements. No dyspnea or cough.   Objective   Objective: Filed Vitals:   03/06/12 1955 03/06/12 2025 03/06/12 2125 03/07/12 0500  BP:  108/57 108/62 100/61  Pulse:  112 112 100  Temp:  98.1 F (36.7 C) 99.8 F (37.7 C) 98.3 F (36.8 C)  TempSrc:  Oral Oral Oral  Resp:  17 19 18   Height: 5' 4.17" (1.63 m)  5\' 4"  (1.626 m)   Weight: 93.4 kg (205 lb 14.6 oz)  93.7 kg (206 lb 9.1 oz)   SpO2:  95% 96% 96%    Intake/Output Summary (Last 24 hours) at 03/07/12  1126 Last data filed at 03/07/12 0820  Gross per 24 hour  Intake    240 ml  Output    300 ml  Net    -60 ml    Exam: Gen:  NAD,  jaundiced Cardiovascular:  RRR, No M/R/G Respiratory: Lungs diminished Gastrointestinal: Abdomen softly distended, bloated, tender lower abdomen. Extremities: No C/E/C    Data Reviewed: Basic Metabolic Panel:  Lab 03/07/12 1914 03/06/12 2000 03/06/12 1535 03/05/12 1533  NA 130* -- 129* 129*  K 4.0 -- 4.1 --  CL 97 -- 95* 95*  CO2 21 -- 22 24  GLUCOSE 186* -- 197* 187*  BUN 34* -- 32* 30*  CREATININE 1.20* -- 1.09 0.97  CALCIUM 7.5* -- 7.8* 8.0*  MG -- 2.4 -- --  PHOS -- 3.3 -- --   GFR Estimated Creatinine Clearance: 65.7 ml/min (by C-G formula based on Cr of 1.2). Liver Function Tests:  Lab 03/07/12 0400 03/06/12 1535 03/05/12 1533  AST 170* 206* 233*  ALT 64* 72* 81*  ALKPHOS 507* 650* 748*  BILITOT 14.7* 16.2* 16.0*  PROT 4.6* 4.9* 5.0*  ALBUMIN 1.4* 1.6* 1.6*    Lab 03/06/12 1535  LIPASE 8*  AMYLASE --   CBC:  Lab 03/07/12 0400 03/06/12 1535 03/05/12 1533  WBC 2.4* 1.5* 1.2*  NEUTROABS -- 0.4* 0.4*  HGB 9.1* 10.3* 10.5*  HCT 28.2* 31.7* 31.8*  MCV 88.4 88.5 87.1  PLT 87* 100* 103*   CBG:  Lab 03/07/12 0812 03/06/12 1413  GLUCAP 180* 199*   Hgb A1c  Basename 03/06/12 2000  HGBA1C 10.9*   Thyroid function studies  Basename 03/06/12 2000  TSH 1.400  T4TOTAL --  T3FREE --  THYROIDAB --    Procedures and Diagnostic Studies:  Dg Chest 2 View 03/06/2012   IMPRESSION:  1.  Left IJ Port-A-Cath is in satisfactory and stable position. 2.  Low lung volumes. 3.  No acute cardiopulmonary disease.  Original Report Authenticated By: Jamesetta Orleans. MATTERN, M.D.    US Abdomen Complete 03/06/2012  IMPRESSION: Heterogeneous liver echogenicity with nodular contour, most in keeping with known metastatic disease.  Original Report Authenticated By: Waneta Martins, M.D.     Scheduled Meds:    . acetaminophen  650 mg Oral Once  . ciprofloxacin  400 mg Intravenous Once  . ciprofloxacin  400 mg Intravenous Q12H  . enoxaparin  50 mg Subcutaneous Q24H  .  fentaNYL  100 mcg Transdermal Q72H  . ondansetron  4 mg Intravenous Once  . pantoprazole (PROTONIX) IV  40 mg Intravenous Q24H  . sodium chloride  1,000 mL Intravenous Once  . sucralfate  1 g Oral TID AC & HS  . DISCONTD: ciprofloxacin  200 mg Intravenous Q12H  . DISCONTD: ciprofloxacin  500 mg Oral BID  . DISCONTD: famotidine  20 mg Oral Daily   Continuous Infusions:    . sodium chloride 100 mL/hr at 03/07/12 0458      LOS: 1 day   Hillery Aldo, MD Pager [phone]  03/07/2012, 11:26 AM

## 2012-03-07 NOTE — Progress Notes (Signed)
INITIAL ADULT NUTRITION ASSESSMENT Date: 03/07/2012   Time: 2:27 PM Reason for Assessment: Consult  ASSESSMENT: Female 46 y.o.  Dx: Breast cancer metastasized to bone  Food/Nutrition Related Hx: Pt with stage IV metastatic breast CA on 2nd round of chemotherapy, last given on 03/05/12. Pt reports she has not eaten anything in almost 1 week but did eat 100% of lunch today which consisted of a cheeseburger. Pt reports she was eating better before chemotherapy 2 weeks ago, however since then she has unintentionally lost 25 pounds. Pt does not like nutritional supplements. Pt with uncontrolled diabetes and states she does not check her blood sugars at home. Pt with history of reflux which she stated feels like has "moved up" in her chest since admission. Pt reports nausea on admission has improved. Pt with diffuse jaundice which is new compared to May 2013.   Hx:  Past Medical History  Diagnosis Date  . Diabetes mellitus   . Arthritis   . Cancer 01/23/2012   Related Meds:  Scheduled Meds:   . acetaminophen  650 mg Oral Once  . ciprofloxacin  400 mg Intravenous Once  . ciprofloxacin  400 mg Intravenous Q12H  . enoxaparin  50 mg Subcutaneous Q24H  . fentaNYL  100 mcg Transdermal Q72H  . insulin aspart  0-15 Units Subcutaneous TID WC  . insulin aspart  0-5 Units Subcutaneous QHS  . ondansetron  4 mg Intravenous Once  . pantoprazole (PROTONIX) IV  40 mg Intravenous Q24H  . sodium chloride  1,000 mL Intravenous Once  . sucralfate  1 g Oral TID AC & HS  . DISCONTD: ciprofloxacin  200 mg Intravenous Q12H  . DISCONTD: ciprofloxacin  500 mg Oral BID  . DISCONTD: famotidine  20 mg Oral Daily   Continuous Infusions:   . sodium chloride 100 mL/hr at 03/07/12 0458   PRN Meds:.HYDROcodone-acetaminophen, lidocaine-prilocaine, morphine injection, ondansetron (ZOFRAN) IV, ondansetron  Ht: 5\' 4"  (162.6 cm)  Wt: 206 lb 9.1 oz (93.7 kg)  Ideal Wt: 120 lb % Ideal Wt: 172  Usual Wt: 231 lb per pt  report % Usual Wt: 89  Wt Readings from Last 10 Encounters:  03/06/12 206 lb 9.1 oz (93.7 kg)  02/28/12 206 lb (93.441 kg)  02/27/12 206 lb 14.4 oz (93.849 kg)  02/23/12 211 lb (95.709 kg)  02/20/12 211 lb 9.6 oz (95.981 kg)  02/09/12 213 lb 1.6 oz (96.662 kg)  02/05/12 212 lb 6.4 oz (96.344 kg)  01/23/12 215 lb 12.8 oz (97.886 kg)    Body mass index is 35.46 kg/(m^2). Class II obesity   Labs:  CMP     Component Value Date/Time   NA 130* 03/07/2012 0400   K 4.0 03/07/2012 0400   CL 97 03/07/2012 0400   CO2 21 03/07/2012 0400   GLUCOSE 186* 03/07/2012 0400   BUN 34* 03/07/2012 0400   CREATININE 1.20* 03/07/2012 0400   CALCIUM 7.5* 03/07/2012 0400   PROT 4.6* 03/07/2012 0400   ALBUMIN 1.4* 03/07/2012 0400   AST 170* 03/07/2012 0400   ALT 64* 03/07/2012 0400   ALKPHOS 507* 03/07/2012 0400   BILITOT 14.7* 03/07/2012 0400   GFRNONAA 54* 03/07/2012 0400   GFRAA 62* 03/07/2012 0400   Lab Results  Component Value Date   HGBA1C 10.9* 03/06/2012   CBG (last 3)   Basename 03/07/12 1152 03/07/12 0812 03/06/12 1413  GLUCAP 190* 180* 199*    Intake/Output Summary (Last 24 hours) at 03/07/12 1435 Last data filed at 03/07/12 1343  Gross per 24 hour  Intake 1606.67 ml  Output    300 ml  Net 1306.67 ml   Last BM - 03/07/12  Diet Order: Carb Control   IVF:    sodium chloride Last Rate: 100 mL/hr at 03/07/12 0458    Estimated Nutritional Needs:   Kcal: 1650-1950 Protein: 65-85g Fluid: 1.6-1.9L  NUTRITION DIAGNOSIS: -Inadequate oral intake (NI-2.1).  Status: Ongoing -Pt meets criteria for severe PCM of chronic illness AEB reported 10.8% weight loss in the past 2 months with <50% energy intake for the past 6 days with worsening fatigue and weakness  RELATED TO: metastatic breast CA with nausea  AS EVIDENCE BY: pt statement with unintended weight loss  MONITORING/EVALUATION(Goals): Pt to consume >90% of meals.   EDUCATION NEEDS: -Education needs addressed - discussed and provided nutrition  therapy for nausea and diabetic diet  INTERVENTION: Pt agreeable to trying ice cream supplements of Magic Cup BID. Encouraged increased intake now that nausea improved. Provided pt with [facility] RD contact information.   Dietitian #: [phone]  DOCUMENTATION CODES Per approved criteria  -Severe malnutrition in the context of acute illness or injury -Obesity Unspecified    Marshall Cork 03/07/2012, 2:27 PM
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
## 2012-03-07 NOTE — Progress Notes (Signed)
Inpatient Diabetes Program Recommendations  AACE/ADA: New Consensus Statement on Inpatient Glycemic Control (2009)  Target Ranges:  Prepandial:   less than 140 mg/dL      Peak postprandial:   less than 180 mg/dL (1-2 hours)      Critically ill patients:  140 - 180 mg/dL   Reason for Visit: OZHY8M - 10.9   Pt with stage IV metastatic breast CA on 2nd round of chemotherapy, last given on 03/05/12.  Results for GERTIE, BROERMAN (MRN [id number]) as of 03/07/2012 19:18  Ref. Range 03/06/2012 20:00  Hemoglobin A1C Latest Range: <5.7 % 10.9 (H)  Results for KASHA, HOWETH (MRN [id number]) as of 03/07/2012 19:18  Ref. Range 03/06/2012 14:13 03/07/2012 08:12 03/07/2012 11:52 03/07/2012 17:06  Glucose-Capillary Latest Range: 70-99 mg/dL 010 (H) 272 (H) 536 (H) 153 (H)    Note: Would recommend blood sugar goal of <200 mg/dL with current diagnosis.  Would stress importance of eating nutrient dense foods and agree with RD consultation of adding supplement.  If intake increases and blood sugars increase > 200, would add sm amt of rapid acting insulin with meals.    Will continue to follow.
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
## 2012-03-08 ENCOUNTER — Telehealth: Payer: Self-pay | Admitting: Oncology

## 2012-03-08 DIAGNOSIS — E669 Obesity, unspecified: Secondary | ICD-10-CM | POA: Diagnosis present

## 2012-03-08 LAB — CBC
MCH: 29 pg (ref 26.0–34.0)
MCV: 88.7 fL (ref 78.0–100.0)
Platelets: 78 10*3/uL — ABNORMAL LOW (ref 150–400)
RDW: 15.8 % — ABNORMAL HIGH (ref 11.5–15.5)
WBC: 5.6 10*3/uL (ref 4.0–10.5)

## 2012-03-08 LAB — COMPREHENSIVE METABOLIC PANEL
Albumin: 1.4 g/dL — ABNORMAL LOW (ref 3.5–5.2)
Alkaline Phosphatase: 433 U/L — ABNORMAL HIGH (ref 39–117)
BUN: 38 mg/dL — ABNORMAL HIGH (ref 6–23)
CO2: 20 mEq/L (ref 19–32)
Chloride: 101 mEq/L (ref 96–112)
GFR calc non Af Amer: 51 mL/min — ABNORMAL LOW (ref >90)
Potassium: 3.5 mEq/L (ref 3.5–5.1)
Total Bilirubin: 12.3 mg/dL — ABNORMAL HIGH (ref 0.3–1.2)

## 2012-03-08 LAB — URINE CULTURE

## 2012-03-08 MED ORDER — POTASSIUM CHLORIDE IN NACL 20-0.9 MEQ/L-% IV SOLN
INTRAVENOUS | Status: DC
  Administered 2012-03-08 – 2012-03-09 (×3): via INTRAVENOUS
  Administered 2012-03-10: 100 mL/h via INTRAVENOUS
  Administered 2012-03-10: 20 mL/h via INTRAVENOUS
  Administered 2012-03-12 – 2012-03-20 (×5): via INTRAVENOUS
  Filled 2012-03-08 (×18): qty 1000

## 2012-03-08 MED ORDER — SUCRALFATE 1 G PO TABS
1.0000 g | ORAL_TABLET | Freq: Three times a day (TID) | ORAL | Status: DC
  Administered 2012-03-08 – 2012-03-20 (×42): 1 g via ORAL
  Filled 2012-03-08 (×55): qty 1

## 2012-03-08 MED ORDER — PANTOPRAZOLE SODIUM 40 MG PO TBEC
40.0000 mg | DELAYED_RELEASE_TABLET | Freq: Every day | ORAL | Status: DC
  Administered 2012-03-08 – 2012-03-20 (×13): 40 mg via ORAL
  Filled 2012-03-08 (×13): qty 1

## 2012-03-08 MED ORDER — ALUM & MAG HYDROXIDE-SIMETH 200-200-20 MG/5ML PO SUSP
15.0000 mL | Freq: Four times a day (QID) | ORAL | Status: DC | PRN
  Administered 2012-03-08: 20:00:00 via ORAL
  Administered 2012-03-18: 15 mL via ORAL
  Filled 2012-03-08 (×2): qty 30

## 2012-03-08 NOTE — Progress Notes (Signed)
PT Cancellation Note  Treatment cancelled today due to pt reported feeling dizzy this morning and upon checking back this afternoon, pt reports she's not ready for PT today and wishes for therapy to check back tomorrow.  Vicki Sanders,[name] 03/08/2012, 2:37 PM Pager: [phone]

## 2012-03-08 NOTE — Progress Notes (Signed)
UR done. 

## 2012-03-08 NOTE — Progress Notes (Signed)
PROGRESS NOTE  Vicki Sanders AVW:[id number] DOB: 1966/01/08 DOA: 03/06/2012 PCP: Sheila Oats, MD Oncologist: Dr. Drue Second  Brief narrative: Vicki Sanders is a 46 year old female with a past medical history of stage IV breast cancer, widely metastatic, who began chemotherapy last week with last dose given 03/05/2012 presented to the hospital on 03/06/2012 a chief complaint of fatigue, poor oral intake, and right upper quadrant abdominal pain. Upon initial evaluation emergency department, the patient was found to have transaminitis. An abdominal ultrasound was done which showed a normal bile duct.  Interim History: Stable overnight.  RN reports that the patient has complained of dizziness.  C. Diff and urine cultures have returned negative.   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 First chemotherapy with Taxotere and carboplatinum given 02/29/2012 with subsequent dose given 03/05/2012.  Also on Herceptin.  Dietitian, physical therapy, and occupational therapy evaluations requested. Active Problems:  Neutropenic fever  Unclear etiology, but put on empiric Cipro for suspected urinary tract infection, which we will now D/C given negative urine culture.  Blood and urine cultures sent, negative to date.  Stool for Clostridium difficile sent, negative.  Fever resolved, d/c empiric antibiotics.  Urinary tract infection  Urinalysis reviewed and is positive for nitrites, 3-6 white blood cells, and a few bacteria.  Urine cultures sent, negative.  On empiric Cipro which we will now d/c.  Pancytopenia due to chemotherapy  White blood cell count normalized.  Platelets and hemoglobin relatively stable, but trending down.  Continue to monitor closely  Transaminitis likely due to liver metastasis  Liver ultrasound consistent with metastatic disease.  No  evidence of common bile duct obstruction. No indication for stenting at this time.  Hyponatremia  May be related to dehydration versus SIADH in the setting of known malignancy.  Improved slightly 129---> 130--->133  with IV fluids.  DM (diabetes mellitus), type 2, uncontrolled  Hemoglobin A1c indicative of poorly controlled diabetes.  The patient states she's been on diabetes medicines in the past, but stopped these due to inability to afford them.  Placed on moderate scale sliding scale insulin 03/07/12.  CBGs 144-190.  Seen by Diabetes coordinator on 03/07/12.  Continue carbohydrate modified diet.  Acute kidney injury  Baseline creatinine 0.63 on 02/20/2012. Current creatinine elevated over her usual baseline values, and likely due to a prerenal etiology.  Continue to hydrate and monitor creatinine.  Protein calorie malnutrition / Obesity BMI 35.2  Poor protein stores in the setting of decreased oral intake secondary to cancer.  Dietitian consultation performed 03/07/12.  Code Status: Full  Family Communication: None at bedside. Disposition Plan: Home when stable  Medical Consultants:  None  Other consultants:  Diabetes coordinator  Dietitian  Physical therapy  Antibiotics:  Cipro 03/06/2012--->03/08/12  Flagyl 03/07/12--->03/08/12   Subjective  Vicki Sanders denies current pain.  She had some nausea and vomiting last night.  Diarrhea has improved.  Poor appetite.  No fevers.  +Dizziness.   Objective   Objective: Filed Vitals:   03/07/12 0500 03/07/12 1305 03/07/12 2050 03/08/12 0450  BP: 100/61 94/61 96/57  106/65  Pulse: 100 100 99 100  Temp: 98.3 F (36.8 C) 98.2 F (36.8 C) 98.5 F (36.9 C) 98.1 F (36.7 C)  TempSrc: Oral Oral Oral Oral  Resp: 18 18 18 18   Height:      Weight:      SpO2: 96% 96% 96% 96%    Intake/Output  Summary (Last 24 hours) at 03/08/12 2956 Last data filed at 03/08/12 0600  Gross per 24 hour  Intake 3251.34 ml  Output      2 ml    Net 3249.34 ml    Exam: Gen:  NAD, jaundiced Cardiovascular:  RRR, No M/R/G Respiratory: Lungs diminished Gastrointestinal: Abdomen softly distended, bloated, tender lower abdomen. Extremities: No C/E/C    Data Reviewed: Basic Metabolic Panel:  Lab 03/08/12 2130 03/07/12 0400 03/06/12 2000 03/06/12 1535 03/05/12 1533  NA 133* 130* -- 129* 129*  K 3.5 4.0 -- -- --  CL 101 97 -- 95* 95*  CO2 20 21 -- 22 24  GLUCOSE 148* 186* -- 197* 187*  BUN 38* 34* -- 32* 30*  CREATININE 1.25* 1.20* -- 1.09 0.97  CALCIUM 7.5* 7.5* -- 7.8* 8.0*  MG -- -- 2.4 -- --  PHOS -- -- 3.3 -- --   GFR Estimated Creatinine Clearance: 63.1 ml/min (by C-G formula based on Cr of 1.25). Liver Function Tests:  Lab 03/08/12 0550 03/07/12 0400 03/06/12 1535 03/05/12 1533  AST 159* 170* 206* 233*  ALT 55* 64* 72* 81*  ALKPHOS 433* 507* 650* 748*  BILITOT 12.3* 14.7* 16.2* 16.0*  PROT 4.8* 4.6* 4.9* 5.0*  ALBUMIN 1.4* 1.4* 1.6* 1.6*    Lab 03/06/12 1535  LIPASE 8*  AMYLASE --   CBC:  Lab 03/08/12 0550 03/07/12 0400 03/06/12 1535 03/05/12 1533  WBC 5.6 2.4* 1.5* 1.2*  NEUTROABS -- -- 0.4* 0.4*  HGB 9.0* 9.1* 10.3* 10.5*  HCT 27.5* 28.2* 31.7* 31.8*  MCV 88.7 88.4 88.5 87.1  PLT 78* 87* 100* 103*   CBG:  Lab 03/08/12 0722 03/07/12 2151 03/07/12 1706 03/07/12 1152 03/07/12 0812  GLUCAP 144* 176* 153* 190* 180*   Hgb A1c  Basename 03/06/12 2000  HGBA1C 10.9*   Thyroid function studies  Basename 03/06/12 2000  TSH 1.400  T4TOTAL --  T3FREE --  THYROIDAB --   Results for orders placed during the hospital encounter of 03/06/12  URINE CULTURE     Status: Normal   Collection Time   03/06/12  6:44 PM      Component Value Range Status Comment   Specimen Description URINE, CATHETERIZED   Final    Special Requests NONE   Final    Culture  Setup Time 03/07/2012 02:02   Final    Colony Count NO GROWTH   Final    Culture NO GROWTH   Final    Report Status 03/08/2012 FINAL   Final    CULTURE, BLOOD (ROUTINE X 2)     Status: Normal (Preliminary result)   Collection Time   03/06/12  8:00 PM      Component Value Range Status Comment   Specimen Description BLOOD LEFT WRIST   Final    Special Requests BOTTLES DRAWN AEROBIC AND ANAEROBIC 5CC   Final    Culture  Setup Time 03/07/2012 02:11   Final    Culture     Final    Value:        BLOOD CULTURE RECEIVED NO GROWTH TO DATE CULTURE WILL BE HELD FOR 5 DAYS BEFORE ISSUING A FINAL NEGATIVE REPORT   Report Status PENDING   Incomplete   CULTURE, BLOOD (ROUTINE X 2)     Status: Normal (Preliminary result)   Collection Time   03/06/12  8:15 PM      Component Value Range Status Comment   Specimen Description BLOOD RIGHT ARM   Final  Special Requests BOTTLES DRAWN AEROBIC AND ANAEROBIC 3CC   Final    Culture  Setup Time 03/07/2012 02:11   Final    Culture     Final    Value:        BLOOD CULTURE RECEIVED NO GROWTH TO DATE CULTURE WILL BE HELD FOR 5 DAYS BEFORE ISSUING A FINAL NEGATIVE REPORT   Report Status PENDING   Incomplete   CLOSTRIDIUM DIFFICILE BY PCR     Status: Normal   Collection Time   03/07/12  6:47 AM      Component Value Range Status Comment   C difficile by pcr NEGATIVE  NEGATIVE Final    Procedures and Diagnostic Studies:  Dg Chest 2 View 03/06/2012   IMPRESSION:  1.  Left IJ Port-A-Cath is in satisfactory and stable position. 2.  Low lung volumes. 3.  No acute cardiopulmonary disease.  Original Report Authenticated By: Jamesetta Orleans. MATTERN, M.D.    US Abdomen Complete 03/06/2012  IMPRESSION: Heterogeneous liver echogenicity with nodular contour, most in keeping with known metastatic disease.  Original Report Authenticated By: Waneta Martins, M.D.     Scheduled Meds:    . ciprofloxacin  400 mg Intravenous Q12H  . enoxaparin  50 mg Subcutaneous Q24H  . fentaNYL  100 mcg Transdermal Q72H  . insulin aspart  0-15 Units Subcutaneous TID WC  . insulin aspart  0-5 Units Subcutaneous QHS  . metronidazole  500  mg Intravenous Q8H  . pantoprazole  40 mg Oral Q1200  . sucralfate  1 g Oral TID AC & HS  . DISCONTD: pantoprazole (PROTONIX) IV  40 mg Intravenous Q24H   Continuous Infusions:    . sodium chloride 100 mL/hr at 03/08/12 0257      LOS: 2 days   Hillery Aldo, MD Pager [phone]  03/08/2012, 9:27 AM

## 2012-03-08 NOTE — Progress Notes (Signed)
Subjective: Patient was admitted on 7/3 with fatigue poor oral intake right upper quadrant abdominal pain. She seems to still continues to be fatigued with ongoing nausea lower, pelvic pain. She Was empirically being treated for a presumed UTI with Cipro which Has now been discontinued. Her counts have recovered with a normal white count of 5.6. She seems to be very anemic likely due to her  Chemotherapy. She does have abdominal pain but she is taking final patches and she does tell me that that is helping her a little bit. Objective:  Most recent Vital signs: Blood pressure 107/64, pulse 95, temperature 98.2 F (36.8 C), temperature source Oral, resp. rate 16, height 5\' 4"  (1.626 m), weight 206 lb 9.1 oz (93.7 kg), last menstrual period 03/02/2012, SpO2 96.00%. 5\' 4"  (1.626 m)    Body surface area is 2.06 meters squared.  Vital signs in last 24 hours: Temp:  [98.1 F (36.7 C)-98.5 F (36.9 C)] 98.2 F (36.8 C) (07/05 1400) Pulse Rate:  [95-100] 95  (07/05 1400) Resp:  [16-18] 16  (07/05 1400) BP: (96-107)/(57-65) 107/64 mmHg (07/05 1400) SpO2:  [96 %] 96 % (07/05 1400)  Intake/Output from previous day: 07/04 0701 - 07/05 0700 In: 3491.3 [P.O.:420; I.V.:3071.3] Out: 2 [Emesis/NG output:1; Stool:1]  Physical Exam: General appearance: alert, cooperative, appears older than stated age, fatigued, icteric, moderate distress, morbidly obese, pale and slowed mentation Resp: clear to auscultation bilaterally Breasts: normal appearance, no masses or tenderness, right breast mass is palpable no tenderness Cardio: regular rate and rhythm, S1, S2 normal, no murmur, click, rub or gallop GI: lower abdominal tenderness on palpation liver is palpable Extremities: edema +1  Lab Results:   Basename 03/08/12 0550 03/07/12 0400  WBC 5.6 2.4*  HGB 9.0* 9.1*  HCT 27.5* 28.2*  PLT 78* 87*   BMET:  Basename 03/08/12 0550 03/07/12 0400  NA 133* 130*  K 3.5 4.0  CL 101 97  CO2 20 21  GLUCOSE  148* 186*  BUN 38* 34*  CREATININE 1.25* 1.20*  CALCIUM 7.5* 7.5*   CMP:     Component Value Date/Time   NA 133* 03/08/2012 0550   K 3.5 03/08/2012 0550   CL 101 03/08/2012 0550   CO2 20 03/08/2012 0550   GLUCOSE 148* 03/08/2012 0550   BUN 38* 03/08/2012 0550   CREATININE 1.25* 03/08/2012 0550   CALCIUM 7.5* 03/08/2012 0550   PROT 4.8* 03/08/2012 0550   ALBUMIN 1.4* 03/08/2012 0550   AST 159* 03/08/2012 0550   ALT 55* 03/08/2012 0550   ALKPHOS 433* 03/08/2012 0550   BILITOT 12.3* 03/08/2012 0550   GFRNONAA 51* 03/08/2012 0550   GFRAA 59* 03/08/2012 0550   Micro: Results for orders placed during the hospital encounter of 03/06/12  URINE CULTURE     Status: Normal   Collection Time   03/06/12  6:44 PM      Component Value Range Status Comment   Specimen Description URINE, CATHETERIZED   Final    Special Requests NONE   Final    Culture  Setup Time 03/07/2012 02:02   Final    Colony Count NO GROWTH   Final    Culture NO GROWTH   Final    Report Status 03/08/2012 FINAL   Final   CULTURE, BLOOD (ROUTINE X 2)     Status: Normal (Preliminary result)   Collection Time   03/06/12  8:00 PM      Component Value Range Status Comment   Specimen Description BLOOD LEFT  WRIST   Final    Special Requests BOTTLES DRAWN AEROBIC AND ANAEROBIC 5CC   Final    Culture  Setup Time 03/07/2012 02:11   Final    Culture     Final    Value:        BLOOD CULTURE RECEIVED NO GROWTH TO DATE CULTURE WILL BE HELD FOR 5 DAYS BEFORE ISSUING A FINAL NEGATIVE REPORT   Report Status PENDING   Incomplete   CULTURE, BLOOD (ROUTINE X 2)     Status: Normal (Preliminary result)   Collection Time   03/06/12  8:15 PM      Component Value Range Status Comment   Specimen Description BLOOD RIGHT ARM   Final    Special Requests BOTTLES DRAWN AEROBIC AND ANAEROBIC 3CC   Final    Culture  Setup Time 03/07/2012 02:11   Final    Culture     Final    Value:        BLOOD CULTURE RECEIVED NO GROWTH TO DATE CULTURE WILL BE HELD FOR 5 DAYS BEFORE ISSUING A  FINAL NEGATIVE REPORT   Report Status PENDING   Incomplete   CLOSTRIDIUM DIFFICILE BY PCR     Status: Normal   Collection Time   03/07/12  6:47 AM      Component Value Range Status Comment   C difficile by pcr NEGATIVE  NEGATIVE Final       Studies/Results: Dg Chest 2 View  03/06/2012  *RADIOLOGY REPORT*  Clinical Data: Weakness. History of breast cancer.  CHEST - 2 VIEW  Comparison: None.  Findings: The heart size is normal.  Left IJ Port-A-Cath is in place.  The tip is at the cavoatrial junction.  The lungs are clear.  The visualized soft tissues and bony thorax are unremarkable.  IMPRESSION:  1.  Left IJ Port-A-Cath is in satisfactory and stable position. 2.  Low lung volumes. 3.  No acute cardiopulmonary disease.  Original Report Authenticated By: Jamesetta Orleans. MATTERN, M.D.   US Abdomen Complete  03/06/2012  *RADIOLOGY REPORT*  Clinical Data:  Transaminase  COMPLETE ABDOMINAL ULTRASOUND  Comparison:  01/22/2012  Findings:  Gallbladder:  No gallstones or pericholecystic fluid. Contracted.  Common bile duct:  Measures 3 mm, within normal limits.  Liver:  Heterogeneous echogenicity with nodular contour in this patient with known metastatic disease to the liver.  IVC:  Appears normal.  Pancreas:  Not well visualized.  Spleen:  Measures 12.4 cm, no focal abnormality visualized.  Right Kidney:  Measures 13.8 cm.  No hydronephrosis or focal abnormality.  Left Kidney:  Measures 13.5 cm.  No hydronephrosis or focal abnormality.  Abdominal aorta:  No aneurysm identified.  IMPRESSION: Heterogeneous liver echogenicity with nodular contour, most in keeping with known metastatic disease.  Original Report Authenticated By: Waneta Martins, M.D.    Medications: I have reviewed the patient's current medications.  Assessment/Plan: 46 y.o. 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 Her next Herceptin and is due on  Tuesday, July 9.  #2 neutropenia secondary to recent chemotherapy this is now resolved since patient did receive Neulasta.  #3 failure to thrive and encourage oral intake she is getting IV hydration.  #4 deconditioning patient is being seen by physical therapy encourage ambulation.  #5 normal LFTs do to underlying widely metastatic breast cancer to the liver. Hopefully these will improve as patient continues to receive her chemotherapy.   #6 psychosocial  patient has a very poor social situation she is being seen by social work. I do think that this is the most important aspect of this patient's care.  #7 patient is a full code. Her overall prognosis remains guarded.      LOS: 2 days   Drue Second, MD Medical/Oncology [facility] [phone] (beeper) [phone] (Office)  03/08/2012, 3:36 PM

## 2012-03-08 NOTE — Telephone Encounter (Signed)
Changed 7/9 f/u to 2 pm per tami. Chemo moved accordingly. Not able to reach pt re change or lm. Start time remains the same due to pt did not have lb and lb was added for 1:30 pm.

## 2012-03-08 NOTE — Progress Notes (Signed)
Pharmacy: IV to PO Protonix  Patient has been receiving IV Protonix. Per Pharmacy and Therapeutics Committee, this patient meets criteria for auto conversion to PO Protonix:   Tolerating a diet of full liquids or better  Tolerating other medications via enteral route  No GIB  Jacci Ruberg PharmD  [phone] 10/02/2011 8:54 AM    

## 2012-03-09 DIAGNOSIS — K1231 Oral mucositis (ulcerative) due to antineoplastic therapy: Secondary | ICD-10-CM | POA: Diagnosis present

## 2012-03-09 LAB — GLUCOSE, CAPILLARY
Glucose-Capillary: 85 mg/dL (ref 70–99)
Glucose-Capillary: 94 mg/dL (ref 70–99)

## 2012-03-09 LAB — CBC
Hemoglobin: 8.7 g/dL — ABNORMAL LOW (ref 12.0–15.0)
MCH: 28.2 pg (ref 26.0–34.0)
MCV: 88.3 fL (ref 78.0–100.0)
RBC: 3.08 MIL/uL — ABNORMAL LOW (ref 3.87–5.11)

## 2012-03-09 LAB — BASIC METABOLIC PANEL
CO2: 20 mEq/L (ref 19–32)
Glucose, Bld: 101 mg/dL — ABNORMAL HIGH (ref 70–99)
Potassium: 3.9 mEq/L (ref 3.5–5.1)
Sodium: 135 mEq/L (ref 135–145)

## 2012-03-09 MED ORDER — MAGIC MOUTHWASH W/LIDOCAINE
10.0000 mL | Freq: Four times a day (QID) | ORAL | Status: DC
  Administered 2012-03-09 – 2012-03-13 (×18): 10 mL via ORAL
  Filled 2012-03-09 (×27): qty 10

## 2012-03-09 NOTE — Progress Notes (Signed)
PROGRESS NOTE  Vicki Sanders JXB:[id number] DOB: 1965-09-11 DOA: 03/06/2012 PCP: Sheila Oats, MD Oncologist: Dr. Drue Second  Brief narrative: Vicki Sanders is a 46 year old female with a past medical history of stage IV breast cancer, widely metastatic, who began chemotherapy last week with last dose given 03/05/2012 presented to the hospital on 03/06/2012 a chief complaint of fatigue, poor oral intake, and right upper quadrant abdominal pain. Upon initial evaluation emergency department, the patient was found to have transaminitis. An abdominal ultrasound was done which showed a normal bile duct.  Interim History: Stable overnight.    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 First chemotherapy with Taxotere and carboplatinum given 02/29/2012 with subsequent dose given 03/05/2012.  Also on Herceptin.  Next Herceptin dose due on 03/12/12.  Dietitian, physical therapy, and occupational therapy evaluations performed. Active Problems:  Mucositis (ulcerative) due to antineoplastic therapy  Continue Carafate.  Add Magic Mouthwash QID.  Neutropenic fever  Status post Neulasta support as an outpatient.  Unclear etiology of fever, but put on empiric Cipro for suspected urinary tract infection, which was discontinued on 03/08/12 after urine culture proved negative.  Blood and urine cultures sent, negative to date.  Stool for Clostridium difficile sent, negative.  Empiric antibiotics continued through 03/08/12, discontinued with negative cultures,n of fever.  Urinary tract infection   resolutioUrinalysis reviewed and was positive for nitrites, 3-6 white blood cells, and a few bacteria.  Urine cultures sent, negative.  Initially on empiric Cipro which was stopped 03/08/12.  Pancytopenia due to chemotherapy  White blood cell count normalized.  Platelets and  hemoglobin relatively stable, but trending down.  Continue to monitor closely  Transaminitis likely due to liver metastasis  Liver ultrasound consistent with metastatic disease.  No evidence of common bile duct obstruction. No indication for stenting at this time.  LFTs trending down.  Hyponatremia  May be related to dehydration versus SIADH in the setting of known malignancy.  Improved slightly 129---> 130--->133--->135 with IV fluids.  DM (diabetes mellitus), type 2, uncontrolled  Hemoglobin A1c indicative of poorly controlled diabetes.  The patient states she's been on diabetes medicines in the past, but stopped these due to inability to afford them.  Placed on moderate scale sliding scale insulin 03/07/12.  CBGs 89-176.  Seen by Diabetes coordinator on 03/07/12.  Continue carbohydrate modified diet.  Patient reports that she has taken insulin in the past, and knows how to give herself insulin shots, so will likely d/c her on insulin.  Acute kidney injury  Baseline creatinine 0.63 on 02/20/2012. Current creatinine elevated over her usual baseline values, and likely due to a prerenal etiology.  Continue to hydrate and monitor creatinine, which is slowly improving.  Severe Protein calorie malnutrition / Obesity BMI 35.2  Poor protein stores in the setting of decreased oral intake secondary to cancer.  Dietitian consultation performed 03/07/12.  Code Status: Full  Family Communication: Boyfriend, Kandis Mannan, updated at bedside. Disposition Plan: Home when stable  Medical Consultants:  Dr. Drue Second, Oncology  Other consultants:  Diabetes coordinator  Dietitian  Physical therapy  Antibiotics:  Cipro 03/06/2012--->03/08/12  Flagyl 03/07/12--->03/08/12   Subjective  Vicki Sanders has mouth sores.  Some nausea but no vomiting, but has a hard time eating because of mouth discomfort.  Pain controlled with Fentanyl patch and OxyIR for breakthrough pain.  Occasional diarrhea.  No  fever or chills.   Objective   Objective:  Filed Vitals:   03/08/12 0450 03/08/12 1400 03/08/12 2121 03/09/12 0604  BP: 106/65 107/64 114/67 107/68  Pulse: 100 95 96 94  Temp: 98.1 F (36.7 C) 98.2 F (36.8 C) 98.6 F (37 C) 98.2 F (36.8 C)  TempSrc: Oral Oral Oral Oral  Resp: 18 16 16 18   Height:      Weight:      SpO2: 96% 96% 95% 96%    Intake/Output Summary (Last 24 hours) at 03/09/12 0801 Last data filed at 03/08/12 1345  Gross per 24 hour  Intake 875.74 ml  Output      0 ml  Net 875.74 ml    Exam: Gen:  NAD, jaundiced Cardiovascular:  RRR, No M/R/G Respiratory: Lungs diminished Gastrointestinal: Abdomen softly distended, bloated, tender lower abdomen. Extremities: No C/E/C    Data Reviewed: Basic Metabolic Panel:  Lab 03/09/12 1610 03/08/12 0550 03/07/12 0400 03/06/12 2000 03/06/12 1535 03/05/12 1533  NA 135 133* 130* -- 129* 129*  K 3.9 3.5 -- -- -- --  CL 107 101 97 -- 95* 95*  CO2 20 20 21  -- 22 24  GLUCOSE 101* 148* 186* -- 197* 187*  BUN 36* 38* 34* -- 32* 30*  CREATININE 1.12* 1.25* 1.20* -- 1.09 0.97  CALCIUM 7.7* 7.5* 7.5* -- 7.8* 8.0*  MG -- -- -- 2.4 -- --  PHOS -- -- -- 3.3 -- --   GFR Estimated Creatinine Clearance: 70.4 ml/min (by C-G formula based on Cr of 1.12). Liver Function Tests:  Lab 03/08/12 0550 03/07/12 0400 03/06/12 1535 03/05/12 1533  AST 159* 170* 206* 233*  ALT 55* 64* 72* 81*  ALKPHOS 433* 507* 650* 748*  BILITOT 12.3* 14.7* 16.2* 16.0*  PROT 4.8* 4.6* 4.9* 5.0*  ALBUMIN 1.4* 1.4* 1.6* 1.6*    Lab 03/06/12 1535  LIPASE 8*  AMYLASE --   CBC:  Lab 03/09/12 0610 03/08/12 0550 03/07/12 0400 03/06/12 1535 03/05/12 1533  WBC 8.3 5.6 2.4* 1.5* 1.2*  NEUTROABS -- -- -- 0.4* 0.4*  HGB 8.7* 9.0* 9.1* 10.3* 10.5*  HCT 27.2* 27.5* 28.2* 31.7* 31.8*  MCV 88.3 88.7 88.4 88.5 87.1  PLT 63* 78* 87* 100* 103*   CBG:  Lab 03/09/12 0736 03/08/12 2120 03/08/12 1234 03/08/12 0722 03/07/12 2151  GLUCAP 89 109* 129* 144*  176*   Hgb A1c  Basename 03/06/12 2000  HGBA1C 10.9*   Thyroid function studies  Basename 03/06/12 2000  TSH 1.400  T4TOTAL --  T3FREE --  THYROIDAB --   Results for orders placed during the hospital encounter of 03/06/12  URINE CULTURE     Status: Normal   Collection Time   03/06/12  6:44 PM      Component Value Range Status Comment   Specimen Description URINE, CATHETERIZED   Final    Special Requests NONE   Final    Culture  Setup Time 03/07/2012 02:02   Final    Colony Count NO GROWTH   Final    Culture NO GROWTH   Final    Report Status 03/08/2012 FINAL   Final   CULTURE, BLOOD (ROUTINE X 2)     Status: Normal (Preliminary result)   Collection Time   03/06/12  8:00 PM      Component Value Range Status Comment   Specimen Description BLOOD LEFT WRIST   Final    Special Requests BOTTLES DRAWN AEROBIC AND ANAEROBIC 5CC   Final    Culture  Setup Time 03/07/2012 02:11   Final  Culture     Final    Value:        BLOOD CULTURE RECEIVED NO GROWTH TO DATE CULTURE WILL BE HELD FOR 5 DAYS BEFORE ISSUING A FINAL NEGATIVE REPORT   Report Status PENDING   Incomplete   CULTURE, BLOOD (ROUTINE X 2)     Status: Normal (Preliminary result)   Collection Time   03/06/12  8:15 PM      Component Value Range Status Comment   Specimen Description BLOOD RIGHT ARM   Final    Special Requests BOTTLES DRAWN AEROBIC AND ANAEROBIC 3CC   Final    Culture  Setup Time 03/07/2012 02:11   Final    Culture     Final    Value:        BLOOD CULTURE RECEIVED NO GROWTH TO DATE CULTURE WILL BE HELD FOR 5 DAYS BEFORE ISSUING A FINAL NEGATIVE REPORT   Report Status PENDING   Incomplete   CLOSTRIDIUM DIFFICILE BY PCR     Status: Normal   Collection Time   03/07/12  6:47 AM      Component Value Range Status Comment   C difficile by pcr NEGATIVE  NEGATIVE Final    Procedures and Diagnostic Studies:  Dg Chest 2 View 03/06/2012   IMPRESSION:  1.  Left IJ Port-A-Cath is in satisfactory and stable position. 2.  Low  lung volumes. 3.  No acute cardiopulmonary disease.  Original Report Authenticated By: Jamesetta Orleans. MATTERN, M.D.    US Abdomen Complete 03/06/2012  IMPRESSION: Heterogeneous liver echogenicity with nodular contour, most in keeping with known metastatic disease.  Original Report Authenticated By: Waneta Martins, M.D.     Scheduled Meds:    . enoxaparin  50 mg Subcutaneous Q24H  . fentaNYL  100 mcg Transdermal Q72H  . insulin aspart  0-15 Units Subcutaneous TID WC  . insulin aspart  0-5 Units Subcutaneous QHS  . pantoprazole  40 mg Oral Q1200  . sucralfate  1 g Oral TID WC & HS  . DISCONTD: ciprofloxacin  400 mg Intravenous Q12H  . DISCONTD: metronidazole  500 mg Intravenous Q8H  . DISCONTD: pantoprazole (PROTONIX) IV  40 mg Intravenous Q24H  . DISCONTD: sucralfate  1 g Oral TID AC & HS   Continuous Infusions:    . 0.9 % NaCl with KCl 20 mEq / L 100 mL/hr at 03/09/12 0750  . DISCONTD: sodium chloride 100 mL/hr at 03/08/12 0257      LOS: 3 days   Hillery Aldo, MD Pager [phone]  03/09/2012, 8:01 AM

## 2012-03-09 NOTE — Progress Notes (Signed)
PT Cancellation Note  __x_Treatment cancelled today due to medical issues with patient which prohibited Physical therapy  Pt reports she has not been ablr to get up as this causes her to be very nauseated.will return and attempt at a later time  ___ Treatment cancelled today due to patient receiving procedure or test   ___ Treatment cancelled today due to patient's refusal to participate   ___ Treatment cancelled today due to    [facility] PT [phone]

## 2012-03-10 ENCOUNTER — Inpatient Hospital Stay (HOSPITAL_COMMUNITY): Payer: Medicaid Other

## 2012-03-10 DIAGNOSIS — R040 Epistaxis: Secondary | ICD-10-CM | POA: Diagnosis present

## 2012-03-10 DIAGNOSIS — R109 Unspecified abdominal pain: Secondary | ICD-10-CM | POA: Diagnosis present

## 2012-03-10 LAB — GLUCOSE, CAPILLARY
Glucose-Capillary: 109 mg/dL — ABNORMAL HIGH (ref 70–99)
Glucose-Capillary: 104 mg/dL — ABNORMAL HIGH (ref 70–99)

## 2012-03-10 LAB — BASIC METABOLIC PANEL
CO2: 20 mEq/L (ref 19–32)
Calcium: 7.7 mg/dL — ABNORMAL LOW (ref 8.4–10.5)
Chloride: 108 mEq/L (ref 96–112)
Potassium: 4 mEq/L (ref 3.5–5.1)
Sodium: 136 mEq/L (ref 135–145)

## 2012-03-10 LAB — CBC
HCT: 26.8 % — ABNORMAL LOW (ref 36.0–46.0)
MCV: 89 fL (ref 78.0–100.0)
Platelets: 50 10*3/uL — ABNORMAL LOW (ref 150–400)
RBC: 3.01 MIL/uL — ABNORMAL LOW (ref 3.87–5.11)
WBC: 8.4 10*3/uL (ref 4.0–10.5)

## 2012-03-10 MED ORDER — SALINE SPRAY 0.65 % NA SOLN
1.0000 | NASAL | Status: DC | PRN
  Filled 2012-03-10: qty 44

## 2012-03-10 MED ORDER — IOHEXOL 300 MG/ML  SOLN: 100.0000 mL | Freq: Once | INTRAMUSCULAR | Status: AC | PRN

## 2012-03-10 MED ORDER — FENTANYL 12 MCG/HR TD PT72
112.5000 ug | MEDICATED_PATCH | TRANSDERMAL | Status: DC
  Administered 2012-03-12 – 2012-03-18 (×3): 112.5 ug via TRANSDERMAL
  Filled 2012-03-10 (×3): qty 1

## 2012-03-10 MED ORDER — FENTANYL 100 MCG/HR TD PT72: 100.0000 ug | MEDICATED_PATCH | Freq: Once | TRANSDERMAL | Status: DC

## 2012-03-10 MED ORDER — FENTANYL 12 MCG/HR TD PT72
12.5000 ug | MEDICATED_PATCH | Freq: Once | TRANSDERMAL | Status: AC
  Administered 2012-03-10: 12.5 ug via TRANSDERMAL
  Filled 2012-03-10: qty 1

## 2012-03-10 MED ORDER — FENTANYL 100 MCG/HR TD PT72
MEDICATED_PATCH | TRANSDERMAL | Status: AC
  Administered 2012-03-10: 20:00:00
  Filled 2012-03-10: qty 1

## 2012-03-10 MED ORDER — FENTANYL 100 MCG/HR TD PT72
100.0000 ug | MEDICATED_PATCH | Freq: Once | TRANSDERMAL | Status: AC
  Administered 2012-03-10: 100 ug via TRANSDERMAL

## 2012-03-10 MED ORDER — OXYCODONE-ACETAMINOPHEN 5-325 MG PO TABS
2.0000 | ORAL_TABLET | ORAL | Status: DC | PRN
  Filled 2012-03-10 (×2): qty 2

## 2012-03-10 MED ORDER — ZOLPIDEM TARTRATE 5 MG PO TABS
5.0000 mg | ORAL_TABLET | Freq: Every evening | ORAL | Status: DC | PRN
  Administered 2012-03-10 – 2012-03-13 (×3): 5 mg via ORAL
  Filled 2012-03-10 (×3): qty 1

## 2012-03-10 NOTE — Progress Notes (Addendum)
PROGRESS NOTE  Vicki Sanders ION:[id number] DOB: 03/06/1966 DOA: 03/06/2012 PCP: Sheila Oats, MD Oncologist: Dr. Drue Second  Brief narrative: Vicki Sanders is a 46 year old female with a past medical history of stage IV breast cancer, widely metastatic, who began chemotherapy last week with last dose given 03/05/2012 presented to the hospital on 03/06/2012 a chief complaint of fatigue, poor oral intake, and right upper quadrant abdominal pain. Upon initial evaluation emergency department, the patient was found to have transaminitis. An abdominal ultrasound was done which showed a normal bile duct.  Interim History: Stable overnight.  Continues to feel poorly.  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 First chemotherapy with Taxotere and carboplatinum given 02/29/2012 with subsequent dose given 03/05/2012.  Also on Herceptin.  Next Herceptin dose due on 03/12/12.  Dietitian, physical therapy, and occupational therapy evaluations performed.  Has been refusing PT. Active Problems:  Abdominal pain  Unclear etiology, lower abdomen.  Urine culture negative for UTI.  Supect chemo colitis.  Will check CT abdomen and pelvis given ongoing symptoms.  Diarrhea improving.  Adjust pain medications for better pain control.  Epistaxis secondary to thrombocytopenia  D/C Lovenox.  SCDs for DVT prophylaxis.  Nasal saline PRN.  Mucositis (ulcerative) due to antineoplastic therapy  Continue Carafate and Magic Mouthwash QID.  Neutropenic fever  Status post Neulasta support as an outpatient.  Unclear etiology of fever, but put on empiric Cipro for suspected urinary tract infection, which was discontinued on 03/08/12 after urine culture proved negative.  Blood and urine cultures sent, negative to date.  Stool for Clostridium difficile sent, negative.  Empiric  antibiotics continued through 03/08/12, discontinued with negative cultures, no return of fever.  Urinary tract infection  Urinalysis reviewed and was positive for nitrites, 3-6 white blood cells, and a few bacteria.  Urine cultures sent, negative.  Initially on empiric Cipro which was stopped 03/08/12.  Pancytopenia due to chemotherapy  White blood cell count normalized.  Platelets and hemoglobin relatively stable, but trending down.  Continue to monitor closely.  Transaminitis likely due to liver metastasis  Liver ultrasound consistent with metastatic disease.  No evidence of common bile duct obstruction. No indication for stenting at this time.  LFTs trending down.  Hyponatremia  May be related to dehydration versus SIADH in the setting of known malignancy.  Improved slightly 129---> 130--->133--->135--->136 with IV fluids.  DM (diabetes mellitus), type 2, uncontrolled  Hemoglobin A1c indicative of poorly controlled diabetes.  The patient states she's been on diabetes medicines in the past, but stopped these due to inability to afford them.  Placed on moderate scale sliding scale insulin 03/07/12.  CBGs 85-109.  Seen by Diabetes coordinator on 03/07/12.  Continue carbohydrate modified diet.  Patient reports that she has taken insulin in the past, and knows how to give herself insulin shots, so will likely d/c her on insulin.  Acute kidney injury  Baseline creatinine 0.63 on 02/20/2012. Current creatinine elevated over her usual baseline values, and likely due to a prerenal etiology.  Continue to hydrate and monitor creatinine, which is slowly improving.  Severe Protein calorie malnutrition / Obesity BMI 35.2  Poor protein stores in the setting of decreased oral intake secondary to cancer.  Dietitian consultation performed 03/07/12.  Code Status: Full  Family Communication: Boyfriend, Kandis Mannan, updated at bedside 03/09/12. Disposition Plan: Home when stable  Medical  Consultants:  Dr. Drue Second, Oncology  Other consultants:  Diabetes coordinator  Dietitian  Physical therapy  Antibiotics:  Cipro 03/06/2012--->03/08/12  Flagyl 03/07/12--->03/08/12   Subjective  Vicki Sanders feels worse today.  No nausea or vomiting.  Diarrhea has improved, but has lower abdominal pain.  No dysuria.  No dyspnea or cough.  Has been refusing PT due to feeling poorly.  Complains that her feet are swelling.   Objective   Objective: Filed Vitals:   03/09/12 0604 03/09/12 1423 03/09/12 2105 03/10/12 0517  BP: 107/68 99/61 108/67 109/67  Pulse: 94 95 96 95  Temp: 98.2 F (36.8 C) 98.4 F (36.9 C) 98.3 F (36.8 C) 98.8 F (37.1 C)  TempSrc: Oral Oral Oral Oral  Resp: 18 18 16 18   Height:      Weight:      SpO2: 96% 96% 97% 96%    Intake/Output Summary (Last 24 hours) at 03/10/12 0840 Last data filed at 03/10/12 0731  Gross per 24 hour  Intake   1840 ml  Output   1750 ml  Net     90 ml    Exam: Gen:  NAD, jaundiced Cardiovascular:  RRR, No M/R/G Respiratory: Lungs diminished Gastrointestinal: Abdomen softly distended, bloated, tender lower abdomen. Extremities: Trace edema.    Data Reviewed: Basic Metabolic Panel:  Lab 03/10/12 1610 03/09/12 0610 03/08/12 0550 03/07/12 0400 03/06/12 2000 03/06/12 1535  NA 136 135 133* 130* -- 129*  K 4.0 3.9 -- -- -- --  CL 108 107 101 97 -- 95*  CO2 20 20 20 21  -- 22  GLUCOSE 98 101* 148* 186* -- 197*  BUN 30* 36* 38* 34* -- 32*  CREATININE 0.93 1.12* 1.25* 1.20* -- 1.09  CALCIUM 7.7* 7.7* 7.5* 7.5* -- 7.8*  MG -- -- -- -- 2.4 --  PHOS -- -- -- -- 3.3 --   GFR Estimated Creatinine Clearance: 84.8 ml/min (by C-G formula based on Cr of 0.93). Liver Function Tests:  Lab 03/08/12 0550 03/07/12 0400 03/06/12 1535 03/05/12 1533  AST 159* 170* 206* 233*  ALT 55* 64* 72* 81*  ALKPHOS 433* 507* 650* 748*  BILITOT 12.3* 14.7* 16.2* 16.0*  PROT 4.8* 4.6* 4.9* 5.0*  ALBUMIN 1.4* 1.4* 1.6* 1.6*    Lab  03/06/12 1535  LIPASE 8*  AMYLASE --   CBC:  Lab 03/10/12 0705 03/09/12 0610 03/08/12 0550 03/07/12 0400 03/06/12 1535  WBC 8.4 8.3 5.6 2.4* 1.5*  NEUTROABS -- -- -- -- 0.4*  HGB 8.6* 8.7* 9.0* 9.1* 10.3*  HCT 26.8* 27.2* 27.5* 28.2* 31.7*  MCV 89.0 88.3 88.7 88.4 88.5  PLT 50* 63* 78* 87* 100*   CBG:  Lab 03/10/12 0731 03/09/12 2104 03/09/12 1658 03/09/12 1151 03/09/12 0736  GLUCAP 93 109* 94 85 89   Hgb A1c No results found for this basename: HGBA1C:2 in the last 72 hours Thyroid function studies No results found for this basename: TSH,T4TOTAL,FREET3,T3FREE,THYROIDAB in the last 72 hours Results for orders placed during the hospital encounter of 03/06/12  URINE CULTURE     Status: Normal   Collection Time   03/06/12  6:44 PM      Component Value Range Status Comment   Specimen Description URINE, CATHETERIZED   Final    Special Requests NONE   Final    Culture  Setup Time 03/07/2012 02:02   Final    Colony Count NO GROWTH   Final    Culture NO GROWTH   Final    Report Status 03/08/2012 FINAL   Final   CULTURE,  BLOOD (ROUTINE X 2)     Status: Normal (Preliminary result)   Collection Time   03/06/12  8:00 PM      Component Value Range Status Comment   Specimen Description BLOOD LEFT WRIST   Final    Special Requests BOTTLES DRAWN AEROBIC AND ANAEROBIC 5CC   Final    Culture  Setup Time 03/07/2012 02:11   Final    Culture     Final    Value:        BLOOD CULTURE RECEIVED NO GROWTH TO DATE CULTURE WILL BE HELD FOR 5 DAYS BEFORE ISSUING A FINAL NEGATIVE REPORT   Report Status PENDING   Incomplete   CULTURE, BLOOD (ROUTINE X 2)     Status: Normal (Preliminary result)   Collection Time   03/06/12  8:15 PM      Component Value Range Status Comment   Specimen Description BLOOD RIGHT ARM   Final    Special Requests BOTTLES DRAWN AEROBIC AND ANAEROBIC 3CC   Final    Culture  Setup Time 03/07/2012 02:11   Final    Culture     Final    Value:        BLOOD CULTURE RECEIVED NO GROWTH  TO DATE CULTURE WILL BE HELD FOR 5 DAYS BEFORE ISSUING A FINAL NEGATIVE REPORT   Report Status PENDING   Incomplete   CLOSTRIDIUM DIFFICILE BY PCR     Status: Normal   Collection Time   03/07/12  6:47 AM      Component Value Range Status Comment   C difficile by pcr NEGATIVE  NEGATIVE Final    Procedures and Diagnostic Studies:  Dg Chest 2 View 03/06/2012   IMPRESSION:  1.  Left IJ Port-A-Cath is in satisfactory and stable position. 2.  Low lung volumes. 3.  No acute cardiopulmonary disease.  Original Report Authenticated By: Jamesetta Orleans. MATTERN, M.D.    US Abdomen Complete 03/06/2012  IMPRESSION: Heterogeneous liver echogenicity with nodular contour, most in keeping with known metastatic disease.  Original Report Authenticated By: Waneta Martins, M.D.     Scheduled Meds:    . enoxaparin  50 mg Subcutaneous Q24H  . fentaNYL  100 mcg Transdermal Q72H  . insulin aspart  0-15 Units Subcutaneous TID WC  . insulin aspart  0-5 Units Subcutaneous QHS  . magic mouthwash w/lidocaine  10 mL Oral QID  . pantoprazole  40 mg Oral Q1200  . sucralfate  1 g Oral TID WC & HS   Continuous Infusions:    . 0.9 % NaCl with KCl 20 mEq / L 100 mL/hr (03/10/12 0257)      LOS: 4 days   Hillery Aldo, MD Pager [phone]  03/10/2012, 8:40 AM

## 2012-03-11 LAB — GLUCOSE, CAPILLARY: Glucose-Capillary: 150 mg/dL — ABNORMAL HIGH (ref 70–99)

## 2012-03-11 LAB — BASIC METABOLIC PANEL
BUN: 27 mg/dL — ABNORMAL HIGH (ref 6–23)
CO2: 19 mEq/L (ref 19–32)
Calcium: 7.8 mg/dL — ABNORMAL LOW (ref 8.4–10.5)
GFR calc non Af Amer: 82 mL/min — ABNORMAL LOW (ref >90)
Glucose, Bld: 111 mg/dL — ABNORMAL HIGH (ref 70–99)

## 2012-03-11 LAB — CBC
MCH: 28.4 pg (ref 26.0–34.0)
MCHC: 31.8 g/dL (ref 30.0–36.0)
MCV: 89.4 fL (ref 78.0–100.0)
Platelets: 38 10*3/uL — ABNORMAL LOW (ref 150–400)

## 2012-03-11 NOTE — Progress Notes (Addendum)
From the last conversation, CSW presented the patient with community resources to obtain help at home. CSW also noted speaking with case management. At the time, the patient had a plan to D/C home. CSW brought up a PT noted for skilled placement. The patient is okay with looking at different placement options. CSW will fax the patient out.  Kayleen Memos. Leighton Ruff [phone]

## 2012-03-11 NOTE — Progress Notes (Signed)
UR done. 

## 2012-03-11 NOTE — Progress Notes (Signed)
PROGRESS NOTE  Vicki Sanders ZOX:[id number] DOB: [birth year] DOA: 03/06/2012 PCP: Sheila Oats, MD Oncologist: Dr. Drue Second  Brief narrative: Vicki Sanders is a 46 year old female with a past medical history of stage IV breast cancer, widely metastatic, who began chemotherapy last week with last dose given 03/05/2012 presented to the hospital on 03/06/2012 a chief complaint of fatigue, poor oral intake, and right upper quadrant abdominal pain. Upon initial evaluation emergency department, the patient was found to have transaminitis. An abdominal ultrasound was done which showed a normal bile duct.  Interim History: Stable overnight.  Continues to feel poorly.  Remains dizzy, deconditioned.  PT evaluation done, recommending SNF.  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 First chemotherapy with Taxotere and carboplatinum given 02/29/2012 with subsequent dose given 03/05/2012.  Also on Herceptin.  Next Herceptin dose due on 03/12/12. Dietitian, physical therapy, and occupational therapy evaluations performed.  SNF recommended. Active Problems:  Abdominal pain  Unclear etiology, lower abdomen.  Urine culture negative for UTI.  CT abdomen and pelvis done 03/10/12, no acute findings.  Diarrhea improving.  Pain control improved with medication adjustment.  Epistaxis secondary to thrombocytopenia  Off Lovenox as of 03/10/12.  SCDs for DVT prophylaxis.  Nasal saline PRN.  No further episodes.  Mucositis (ulcerative) due to antineoplastic therapy  Continue Carafate and Magic Mouthwash QID.  Neutropenic fever  Status post Neulasta support as an outpatient.  Unclear etiology of fever, but put on empiric Cipro for suspected urinary tract infection, which was discontinued on 03/08/12 after urine culture proved negative.  Blood and urine cultures sent, negative  to date.  Stool for Clostridium difficile sent, negative.  Empiric antibiotics continued through 03/08/12, discontinued with negative cultures, no return of fever.  Urinary tract infection  Urinalysis reviewed and was positive for nitrites, 3-6 white blood cells, and a few bacteria.  Urine cultures sent, negative.  Initially on empiric Cipro which was stopped 03/08/12.  Pancytopenia due to chemotherapy  White blood cell count normalized.  Platelets and hemoglobin relatively stable, but trending down.  Continue to monitor closely.  Transaminitis likely due to liver metastasis  Liver ultrasound consistent with metastatic disease.  No evidence of common bile duct obstruction. No indication for stenting at this time.  LFTs trending down.  Hyponatremia  May be related to dehydration versus SIADH in the setting of known malignancy.  Normalized with IVF.  DM (diabetes mellitus), type 2, uncontrolled  Hemoglobin A1c indicative of poorly controlled diabetes.  The patient states she's been on diabetes medicines in the past, but stopped these due to inability to afford them.  Placed on moderate scale sliding scale insulin 03/07/12.  CBGs 85-109.  Seen by Diabetes coordinator on 03/07/12.  Continue carbohydrate modified diet.  Patient reports that she has taken insulin in the past, and knows how to give herself insulin shots, so will likely d/c her on insulin.  Acute kidney injury  Baseline creatinine 0.63 on 02/20/2012. Current creatinine elevated over her usual baseline values, and likely due to a prerenal etiology.  Continue to hydrate and monitor creatinine, which is slowly improving.  Severe Protein calorie malnutrition / Obesity BMI 35.2  Poor protein stores in the setting of decreased oral intake secondary to cancer.  Dietitian consultation performed 03/07/12.  Code Status: Full  Family Communication: Boyfriend, Kandis Mannan, updated at bedside 03/11/12. Disposition Plan: Home when  stable  Medical Consultants:  Dr. Drue Second,  Oncology  Other consultants:  Diabetes coordinator  Dietitian  Physical therapy:SNF recommended.  Antibiotics:  Cipro 03/06/2012--->03/08/12  Flagyl 03/07/12--->03/08/12   Subjective  Vicki Sanders continues to feel poorly.  No frank abdominal pain.  Dizzy and weak with any activity.  No vomiting but appetite remains poor.  Diarrhea slowing down, states she had 4 stools over past 24 hours.   Objective   Objective: Filed Vitals:   03/10/12 0517 03/10/12 1401 03/10/12 2036 03/11/12 0559  BP: 109/67 106/53 112/67 122/74  Pulse: 95 92 94 94  Temp: 98.8 F (37.1 C) 98.5 F (36.9 C) 97.7 F (36.5 C) 98.1 F (36.7 C)  TempSrc: Oral Oral Oral Oral  Resp: 18 18 16 16   Height:      Weight:      SpO2: 96% 97% 97% 96%    Intake/Output Summary (Last 24 hours) at 03/11/12 1006 Last data filed at 03/11/12 0500  Gross per 24 hour  Intake 1098.67 ml  Output      0 ml  Net 1098.67 ml    Exam: Gen:  NAD, jaundiced Cardiovascular:  RRR, No M/R/G Respiratory: Lungs diminished Gastrointestinal: Abdomen softly distended, bloated, tender lower abdomen. Extremities: Trace edema.    Data Reviewed: Basic Metabolic Panel:  Lab 03/11/12 1610 03/10/12 0705 03/09/12 0610 03/08/12 0550 03/07/12 0400 03/06/12 2000  NA 135 136 135 133* 130* --  K 3.9 4.0 -- -- -- --  CL 107 108 107 101 97 --  CO2 19 20 20 20 21  --  GLUCOSE 111* 98 101* 148* 186* --  BUN 27* 30* 36* 38* 34* --  CREATININE 0.85 0.93 1.12* 1.25* 1.20* --  CALCIUM 7.8* 7.7* 7.7* 7.5* 7.5* --  MG -- -- -- -- -- 2.4  PHOS -- -- -- -- -- 3.3   GFR Estimated Creatinine Clearance: 92.8 ml/min (by C-G formula based on Cr of 0.85). Liver Function Tests:  Lab 03/08/12 0550 03/07/12 0400 03/06/12 1535 03/05/12 1533  AST 159* 170* 206* 233*  ALT 55* 64* 72* 81*  ALKPHOS 433* 507* 650* 748*  BILITOT 12.3* 14.7* 16.2* 16.0*  PROT 4.8* 4.6* 4.9* 5.0*  ALBUMIN 1.4* 1.4* 1.6*  1.6*    Lab 03/06/12 1535  LIPASE 8*  AMYLASE --   CBC:  Lab 03/11/12 0555 03/10/12 0705 03/09/12 0610 03/08/12 0550 03/07/12 0400 03/06/12 1535  WBC 9.4 8.4 8.3 5.6 2.4* --  NEUTROABS -- -- -- -- -- 0.4*  HGB 8.3* 8.6* 8.7* 9.0* 9.1* --  HCT 26.1* 26.8* 27.2* 27.5* 28.2* --  MCV 89.4 89.0 88.3 88.7 88.4 --  PLT 38* 50* 63* 78* 87* --   CBG:  Lab 03/11/12 0757 03/10/12 2038 03/10/12 1734 03/10/12 1203 03/10/12 0731  GLUCAP 90 117* 104* 96 93   Hgb A1c No results found for this basename: HGBA1C:2 in the last 72 hours Thyroid function studies No results found for this basename: TSH,T4TOTAL,FREET3,T3FREE,THYROIDAB in the last 72 hours Results for orders placed during the hospital encounter of 03/06/12  URINE CULTURE     Status: Normal   Collection Time   03/06/12  6:44 PM      Component Value Range Status Comment   Specimen Description URINE, CATHETERIZED   Final    Special Requests NONE   Final    Culture  Setup Time 03/07/2012 02:02   Final    Colony Count NO GROWTH   Final    Culture NO GROWTH   Final    Report Status 03/08/2012 FINAL  Final   CULTURE, BLOOD (ROUTINE X 2)     Status: Normal (Preliminary result)   Collection Time   03/06/12  8:00 PM      Component Value Range Status Comment   Specimen Description BLOOD LEFT WRIST   Final    Special Requests BOTTLES DRAWN AEROBIC AND ANAEROBIC 5CC   Final    Culture  Setup Time 03/07/2012 02:11   Final    Culture     Final    Value:        BLOOD CULTURE RECEIVED NO GROWTH TO DATE CULTURE WILL BE HELD FOR 5 DAYS BEFORE ISSUING A FINAL NEGATIVE REPORT   Report Status PENDING   Incomplete   CULTURE, BLOOD (ROUTINE X 2)     Status: Normal (Preliminary result)   Collection Time   03/06/12  8:15 PM      Component Value Range Status Comment   Specimen Description BLOOD RIGHT ARM   Final    Special Requests BOTTLES DRAWN AEROBIC AND ANAEROBIC 3CC   Final    Culture  Setup Time 03/07/2012 02:11   Final    Culture     Final     Value:        BLOOD CULTURE RECEIVED NO GROWTH TO DATE CULTURE WILL BE HELD FOR 5 DAYS BEFORE ISSUING A FINAL NEGATIVE REPORT   Report Status PENDING   Incomplete   CLOSTRIDIUM DIFFICILE BY PCR     Status: Normal   Collection Time   03/07/12  6:47 AM      Component Value Range Status Comment   C difficile by pcr NEGATIVE  NEGATIVE Final    Procedures and Diagnostic Studies:  Dg Chest 2 View 03/06/2012   IMPRESSION:  1.  Left IJ Port-A-Cath is in satisfactory and stable position. 2.  Low lung volumes. 3.  No acute cardiopulmonary disease.  Original Report Authenticated By: Jamesetta Orleans. MATTERN, M.D.    US Abdomen Complete 03/06/2012  IMPRESSION: Heterogeneous liver echogenicity with nodular contour, most in keeping with known metastatic disease.  Original Report Authenticated By: Waneta Martins, M.D.     Ct Abdomen Pelvis W Contrast 03/10/2012  IMPRESSION:  1.  Innumerable liver metastases as noted previously. 2.  Widespread osseous metastatic disease as noted previously. 3.  Small amount of ascites, new since the prior examinations from May, 2013. 4.  Lack of excretion of the intravenous contrast into the collecting systems of the kidneys raises the question of renal toxicity of the chemotherapeutic agent.  Clinical correlation. 5.  Anasarca.  6.  Liquid stool throughout the colon without evidence of colonic wall thickening to confirm colitis. 7.  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  Original Report Authenticated By: Arnell Sieving, M.D.   Scheduled Meds:    . fentaNYL      . fentaNYL  100 mcg Transdermal Once  . fentaNYL  112.5 mcg Transdermal Q72H  . fentaNYL  12.5 mcg Transdermal Once  . insulin aspart  0-15 Units Subcutaneous TID WC  . insulin aspart  0-5 Units Subcutaneous QHS  . magic mouthwash w/lidocaine  10 mL Oral QID  . pantoprazole  40 mg Oral Q1200  . sucralfate  1 g  Oral TID WC & HS  . DISCONTD: fentaNYL  100 mcg Transdermal Once   Continuous Infusions:    . 0.9 % NaCl with KCl 20 mEq / L 20 mL/hr (03/10/12 1227)  LOS: 5 days   Hillery Aldo, MD Pager [phone]  03/11/2012, 10:06 AM

## 2012-03-11 NOTE — Evaluation (Signed)
Physical Therapy Evaluation Patient Details Name: Vicki Sanders MRN: [id number] DOB: September 09, 1965 Today's Date: 03/11/2012 Time: 4098-1191 PT Time Calculation (min): 15 min  PT Assessment / Plan / Recommendation Clinical Impression  Pt admitted for transaminitis with past medical history of stage IV breast cancer, widely metastatic, who began chemotherapy recently.  Pt would benefit from acute PT services in order to improve strength, activity tolerance, and independence with ambulation prior to d/c to next venue.  Recommend 24/7 assist if d/c home 2* lightheadedness with ambulation.    PT Assessment  Patient needs continued PT services    Follow Up Recommendations  Skilled nursing facility;Supervision/Assistance - 24 hour    Barriers to Discharge        Equipment Recommendations  Rolling walker with 5" wheels;3 in 1 bedside comode    Recommendations for Other Services     Frequency Min 3X/week    Precautions / Restrictions Precautions Precautions: Fall   Pertinent Vitals/Pain No pain.  Pt reported dizziness with ambulation requiring sitting.  Did not take BP due to bleeding precautions however pt felt better upon resting in recliner.      Mobility  Bed Mobility Bed Mobility: Supine to Sit Supine to Sit: 5: Supervision;HOB elevated Details for Bed Mobility Assistance: increased time, verbal cues for technique Transfers Transfers: Stand to Sit;Sit to Stand Sit to Stand: 4: Min assist;From chair/3-in-1;From bed Stand to Sit: 4: Min assist;To chair/3-in-1 Details for Transfer Assistance: assist for weakness/dizziness Ambulation/Gait Ambulation/Gait Assistance: 4: Min assist Ambulation Distance (Feet): 15 Feet Assistive device: Rolling walker Ambulation/Gait Assistance Details: pt reported increased dizziness during ambulation so brought chair behind pt and assisted to sitting (pt on bleeding precautions so did not take BP, but pt reported feeling better once reclined in  chair) Gait Pattern: Step-through pattern;Decreased stride length Gait velocity: decreased    Exercises     PT Diagnosis: Difficulty walking;Generalized weakness  PT Problem List: Decreased strength;Decreased activity tolerance;Decreased mobility;Decreased safety awareness;Decreased knowledge of use of DME PT Treatment Interventions: DME instruction;Gait training;Functional mobility training;Therapeutic activities;Stair training;Therapeutic exercise;Balance training;Patient/family education   PT Goals Acute Rehab PT Goals PT Goal Formulation: With patient Time For Goal Achievement: 03/18/12 Potential to Achieve Goals: Good Pt will go Supine/Side to Sit: with supervision PT Goal: Supine/Side to Sit - Progress: Goal set today Pt will go Sit to Stand: with supervision PT Goal: Sit to Stand - Progress: Goal set today Pt will go Stand to Sit: with supervision PT Goal: Stand to Sit - Progress: Goal set today Pt will Ambulate: >150 feet;with supervision;with least restrictive assistive device PT Goal: Ambulate - Progress: Goal set today  Visit Information  Last PT Received On: 03/11/12 Assistance Needed: +2    Subjective Data  Subjective: "I just feel nauseated when I change positions."   Prior Functioning  Home Living Lives With: Spouse;Daughter Available Help at Discharge: Family Type of Home: House Home Access: Stairs to enter Secretary/administrator of Steps: 3 Entrance Stairs-Rails: Right Home Layout: One level Home Adaptive Equipment: None Prior Function Level of Independence: Independent Comments: Pt reports family members would ambulate her to bathroom when home. Communication Communication: No difficulties    Cognition  Overall Cognitive Status: Appears within functional limits for tasks assessed/performed Arousal/Alertness: Awake/alert Orientation Level: Appears intact for tasks assessed Behavior During Session: [facility] for tasks performed    Extremity/Trunk  Assessment Right Lower Extremity Assessment RLE ROM/Strength/Tone: [facility] for tasks assessed Left Lower Extremity Assessment LLE ROM/Strength/Tone: [facility] for tasks assessed   Balance  End of Session PT - End of Session Activity Tolerance: Patient limited by fatigue;Other (comment) (limited by dizziness with ambulation) Patient left: in chair;with call bell/phone within reach Nurse Communication: Other (comment) (RN saw pt ambulating in hall)  GP     Vicki Sanders,Vicki Sanders 03/11/2012, 3:08 PM Pager: [phone]

## 2012-03-12 ENCOUNTER — Ambulatory Visit: Payer: Medicaid Other | Admitting: Oncology

## 2012-03-12 ENCOUNTER — Ambulatory Visit: Payer: Self-pay

## 2012-03-12 ENCOUNTER — Other Ambulatory Visit: Payer: Medicaid Other | Admitting: Lab

## 2012-03-12 LAB — CBC
HCT: 25.6 % — ABNORMAL LOW (ref 36.0–46.0)
Hemoglobin: 8 g/dL — ABNORMAL LOW (ref 12.0–15.0)
MCHC: 31.3 g/dL (ref 30.0–36.0)
RDW: 16.7 % — ABNORMAL HIGH (ref 11.5–15.5)
WBC: 9.7 10*3/uL (ref 4.0–10.5)

## 2012-03-12 LAB — GLUCOSE, CAPILLARY
Glucose-Capillary: 167 mg/dL — ABNORMAL HIGH (ref 70–99)
Glucose-Capillary: 181 mg/dL — ABNORMAL HIGH (ref 70–99)

## 2012-03-12 MED ORDER — VITAMINS A & D EX OINT
TOPICAL_OINTMENT | CUTANEOUS | Status: AC
  Administered 2012-03-12: 5
  Filled 2012-03-12: qty 5

## 2012-03-12 MED ORDER — DIAZEPAM 5 MG PO TABS
5.0000 mg | ORAL_TABLET | Freq: Four times a day (QID) | ORAL | Status: DC | PRN
  Administered 2012-03-12 – 2012-03-13 (×3): 5 mg via ORAL
  Filled 2012-03-12 (×3): qty 1

## 2012-03-12 NOTE — Plan of Care (Signed)
Problem: Phase II Progression Outcomes Goal: Discharge plan established Outcome: Progressing Bed requests faxed out for SNF placement

## 2012-03-12 NOTE — Progress Notes (Signed)
Patient was not received any facility offers yet. Concerning issues with placement relate to, the facility not having a female bed, no contract with the insurance and/or the facility not being able to meet the patients needs. The patient was faxed out in [employer]. Other counties with be consulted now the patient was updated about this fact.  Kayleen Memos. Leighton Ruff [phone]

## 2012-03-12 NOTE — Progress Notes (Signed)
Palliative Medicine Team received consult for family support, goals of care, code status requested by Dr Darnelle Catalan.  Chart reviewed, pt admitted 7/3 with neutropenic fever, UTI, transaminitis; abdominal pain, distension, fatigue and poor po intake- in setting of newly diagnosed (June 2013) breast cancer metastatic to liver, lymph and bone, has recently started chemotherapy Contact-significant other Vicki Sanders [phone]) Spoke with patient at bedside who was alert and oriented x 3, appeared comfortable, denied pain or other symptom issues on this visit- Patient states she would like to see if her sister and eldest daughter, who live outside of [place], would be able to participate in PMT meeting -she will ask S.O. Vicki Sanders to contact them - discussed possibility of meeting with patient and Vicki Sanders prior to other family coming to town, though patient stated she preferred to have family present - at patient's request called, left VM for Vicki Sanders [phone]) and received a call back -Vicki Sanders will contact family and call back to PMT phone with time for meeting either tomorrow or Thursday  Vicki David, RN   Palliative Medicine Team RN Liaison  [phone]

## 2012-03-12 NOTE — Progress Notes (Signed)
Nutrition Brief Note  - Met with pt who reports she has been eating better, 50-90% of meals on diabetic diet. Pt reports she ate most of her dinner last night and half of her breakfast and lunch today. Pt reports nausea and abdominal pain improved. Will continue to monitor intake.   Dietitian# [phone]

## 2012-03-12 NOTE — Progress Notes (Signed)
PROGRESS NOTE  Samyiah Halvorsen NWG:[id number] DOB: 11-Apr-1966 DOA: 03/06/2012 PCP: Sheila Oats, MD Oncologist: Dr. Drue Second  Brief narrative: Mrs. Stys is a 46 year old female with a past medical history of stage IV breast cancer, widely metastatic, who began chemotherapy last week with last dose given 03/05/2012 presented to the hospital on 03/06/2012 a chief complaint of fatigue, poor oral intake, and right upper quadrant abdominal pain. Upon initial evaluation emergency department, the patient was found to have transaminitis. An abdominal ultrasound was done which showed a normal bile duct.  Due to significant chemo related toxicity, and a complicated home situation, spoke with Dr. Welton Flakes about obtaining a palliative care consultation.  Both the patient and Dr. Welton Flakes agree to this, so palliative care consultation has been requested.  Interim History: Stable overnight.  Continues to feel poorly.  SW reports 6 facilities have rejected her placement.  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 First chemotherapy with Taxotere and carboplatinum given 02/29/2012 with subsequent dose given 03/05/2012.  Also on Herceptin.  Next Herceptin dose due on 03/12/12.  Have contacted Dr. Welton Flakes to see if she wants this given, awaiting response.  Dietitian, physical therapy, and occupational therapy evaluations performed.  SNF recommended. Active Problems:  Abdominal pain  Unclear etiology, lower abdomen.  Urine culture negative for UTI.  CT abdomen and pelvis done 03/10/12, no acute findings.  Diarrhea improving.  Pain control improved with medication adjustment.  Epistaxis secondary to thrombocytopenia  Off Lovenox as of 03/10/12.  SCDs for DVT prophylaxis.  Nasal saline PRN.  No further episodes.  Mucositis (ulcerative) due to antineoplastic therapy  Continue  Carafate and Magic Mouthwash QID.  Neutropenic fever  Status post Neulasta support as an outpatient.  Unclear etiology of fever, but put on empiric Cipro for suspected urinary tract infection, which was discontinued on 03/08/12 after urine culture proved negative.  Blood and urine cultures sent, negative to date.  Stool for Clostridium difficile sent, negative.  Empiric antibiotics continued through 03/08/12, discontinued with negative cultures, no return of fever.  Urinary tract infection  Urinalysis reviewed and was positive for nitrites, 3-6 white blood cells, and a few bacteria.  Urine cultures sent, negative.  Initially on empiric Cipro which was stopped 03/08/12.  Pancytopenia due to chemotherapy  White blood cell count normalized.  Platelets and hemoglobin relatively stable, but continue to trend down.  Continue to monitor closely.  Transaminitis likely due to liver metastasis  Liver ultrasound consistent with metastatic disease.  No evidence of common bile duct obstruction. No indication for stenting at this time.  LFTs trending down.  Hyponatremia  May be related to dehydration versus SIADH in the setting of known malignancy.  Normalized with IVF.  DM (diabetes mellitus), type 2, uncontrolled  Hemoglobin A1c indicative of poorly controlled diabetes.  The patient states she's been on diabetes medicines in the past, but stopped these due to inability to afford them.  Placed on moderate scale sliding scale insulin 03/07/12.  CBGs 90-150 over past 24 hours.  Seen by Diabetes coordinator on 03/07/12.  Continue carbohydrate modified diet.  Patient reports that she has taken insulin in the past, and knows how to give herself insulin shots, so will likely d/c her on insulin.  Acute kidney injury  Baseline creatinine 0.63 on 02/20/2012. Current creatinine elevated over her usual baseline values, and likely due to a prerenal etiology.  Continue to hydrate and monitor  creatinine, which is slowly improving.  Severe Protein calorie malnutrition / Obesity BMI 35.2  Poor protein stores in the setting of decreased oral intake secondary to cancer.  Dietitian consultation performed 03/07/12.  Code Status: Full  Family Communication: Boyfriend, Kandis Mannan, updated at bedside 03/11/12. Disposition Plan: Home when stable  Medical Consultants:  Dr. Drue Second, Oncology  Palliative Care Team  Other consultants:  Diabetes coordinator  Dietitian  Physical therapy:SNF recommended.  Antibiotics:  Cipro 03/06/2012--->03/08/12  Flagyl 03/07/12--->03/08/12   Subjective  Mrs. Lubinski continues to feel poorly.  No frank abdominal pain but is bloated and complaining of pedal edema.  Appetite remains poor.  Some nausea, no vomiting.  Diarrhea continues to improve.   Objective   Objective: Filed Vitals:   03/11/12 0559 03/11/12 1353 03/11/12 2251 03/12/12 0527  BP: 122/74 101/63 124/50 116/73  Pulse: 94 94 101 97  Temp: 98.1 F (36.7 C) 98.2 F (36.8 C) 98.6 F (37 C) 98.1 F (36.7 C)  TempSrc: Oral Oral Oral Oral  Resp: 16 18 20 18   Height:      Weight:      SpO2: 96% 96% 97% 96%    Intake/Output Summary (Last 24 hours) at 03/12/12 1153 Last data filed at 03/11/12 1339  Gross per 24 hour  Intake    240 ml  Output      0 ml  Net    240 ml    Exam: Gen:  NAD, jaundiced Cardiovascular:  RRR, No M/R/G Respiratory: Lungs diminished Gastrointestinal: Abdomen softly distended, bloated, tender lower abdomen. Extremities: Trace edema.    Data Reviewed: Basic Metabolic Panel:  Lab 03/11/12 1610 03/10/12 0705 03/09/12 0610 03/08/12 0550 03/07/12 0400 03/06/12 2000  NA 135 136 135 133* 130* --  K 3.9 4.0 -- -- -- --  CL 107 108 107 101 97 --  CO2 19 20 20 20 21  --  GLUCOSE 111* 98 101* 148* 186* --  BUN 27* 30* 36* 38* 34* --  CREATININE 0.85 0.93 1.12* 1.25* 1.20* --  CALCIUM 7.8* 7.7* 7.7* 7.5* 7.5* --  MG -- -- -- -- -- 2.4  PHOS -- -- -- --  -- 3.3   GFR Estimated Creatinine Clearance: 92.8 ml/min (by C-G formula based on Cr of 0.85). Liver Function Tests:  Lab 03/08/12 0550 03/07/12 0400 03/06/12 1535 03/05/12 1533  AST 159* 170* 206* 233*  ALT 55* 64* 72* 81*  ALKPHOS 433* 507* 650* 748*  BILITOT 12.3* 14.7* 16.2* 16.0*  PROT 4.8* 4.6* 4.9* 5.0*  ALBUMIN 1.4* 1.4* 1.6* 1.6*    Lab 03/06/12 1535  LIPASE 8*  AMYLASE --   CBC:  Lab 03/12/12 0700 03/11/12 0555 03/10/12 0705 03/09/12 0610 03/08/12 0550 03/06/12 1535  WBC 9.7 9.4 8.4 8.3 5.6 --  NEUTROABS -- -- -- -- -- 0.4*  HGB 8.0* 8.3* 8.6* 8.7* 9.0* --  HCT 25.6* 26.1* 26.8* 27.2* 27.5* --  MCV 90.5 89.4 89.0 88.3 88.7 --  PLT 34* 38* 50* 63* 78* --   CBG:  Lab 03/12/12 0757 03/11/12 1711 03/11/12 1200 03/11/12 0757 03/10/12 2038  GLUCAP 148* 150* 112* 90 117*   Hgb A1c No results found for this basename: HGBA1C:2 in the last 72 hours Thyroid function studies No results found for this basename: TSH,T4TOTAL,FREET3,T3FREE,THYROIDAB in the last 72 hours Results for orders placed during the hospital encounter of 03/06/12  URINE CULTURE     Status: Normal   Collection Time   03/06/12  6:44 PM  Component Value Range Status Comment   Specimen Description URINE, CATHETERIZED   Final    Special Requests NONE   Final    Culture  Setup Time 03/07/2012 02:02   Final    Colony Count NO GROWTH   Final    Culture NO GROWTH   Final    Report Status 03/08/2012 FINAL   Final   CULTURE, BLOOD (ROUTINE X 2)     Status: Normal (Preliminary result)   Collection Time   03/06/12  8:00 PM      Component Value Range Status Comment   Specimen Description BLOOD LEFT WRIST   Final    Special Requests BOTTLES DRAWN AEROBIC AND ANAEROBIC 5CC   Final    Culture  Setup Time 03/07/2012 02:11   Final    Culture     Final    Value:        BLOOD CULTURE RECEIVED NO GROWTH TO DATE CULTURE WILL BE HELD FOR 5 DAYS BEFORE ISSUING A FINAL NEGATIVE REPORT   Report Status PENDING    Incomplete   CULTURE, BLOOD (ROUTINE X 2)     Status: Normal (Preliminary result)   Collection Time   03/06/12  8:15 PM      Component Value Range Status Comment   Specimen Description BLOOD RIGHT ARM   Final    Special Requests BOTTLES DRAWN AEROBIC AND ANAEROBIC 3CC   Final    Culture  Setup Time 03/07/2012 02:11   Final    Culture     Final    Value:        BLOOD CULTURE RECEIVED NO GROWTH TO DATE CULTURE WILL BE HELD FOR 5 DAYS BEFORE ISSUING A FINAL NEGATIVE REPORT   Report Status PENDING   Incomplete   CLOSTRIDIUM DIFFICILE BY PCR     Status: Normal   Collection Time   03/07/12  6:47 AM      Component Value Range Status Comment   C difficile by pcr NEGATIVE  NEGATIVE Final    Procedures and Diagnostic Studies:  Dg Chest 2 View 03/06/2012   IMPRESSION:  1.  Left IJ Port-A-Cath is in satisfactory and stable position. 2.  Low lung volumes. 3.  No acute cardiopulmonary disease.  Original Report Authenticated By: Jamesetta Orleans. MATTERN, M.D.    US Abdomen Complete 03/06/2012  IMPRESSION: Heterogeneous liver echogenicity with nodular contour, most in keeping with known metastatic disease.  Original Report Authenticated By: Waneta Martins, M.D.     Ct Abdomen Pelvis W Contrast 03/10/2012  IMPRESSION:  1.  Innumerable liver metastases as noted previously. 2.  Widespread osseous metastatic disease as noted previously. 3.  Small amount of ascites, new since the prior examinations from May, 2013. 4.  Lack of excretion of the intravenous contrast into the collecting systems of the kidneys raises the question of renal toxicity of the chemotherapeutic agent.  Clinical correlation. 5.  Anasarca.  6.  Liquid stool throughout the colon without evidence of colonic wall thickening to confirm colitis. 7.  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  Original Report Authenticated By: Arnell Sieving, M.D.    Scheduled Meds:    . fentaNYL  100 mcg Transdermal Once  . fentaNYL  112.5 mcg Transdermal Q72H  . fentaNYL  12.5 mcg Transdermal Once  . insulin aspart  0-15 Units Subcutaneous TID WC  . insulin aspart  0-5 Units Subcutaneous QHS  . magic  mouthwash w/lidocaine  10 mL Oral QID  . pantoprazole  40 mg Oral Q1200  . sucralfate  1 g Oral TID WC & HS   Continuous Infusions:    . 0.9 % NaCl with KCl 20 mEq / L 20 mL/hr at 03/12/12 0951      LOS: 6 days   Hillery Aldo, MD Pager [phone]  03/12/2012, 11:53 AM

## 2012-03-13 DIAGNOSIS — R109 Unspecified abdominal pain: Secondary | ICD-10-CM

## 2012-03-13 LAB — GLUCOSE, CAPILLARY
Glucose-Capillary: 154 mg/dL — ABNORMAL HIGH (ref 70–99)
Glucose-Capillary: 170 mg/dL — ABNORMAL HIGH (ref 70–99)
Glucose-Capillary: 184 mg/dL — ABNORMAL HIGH (ref 70–99)
Glucose-Capillary: 186 mg/dL — ABNORMAL HIGH (ref 70–99)
Glucose-Capillary: 161 mg/dL — ABNORMAL HIGH (ref 70–99)

## 2012-03-13 LAB — CBC
MCHC: 32.3 g/dL (ref 30.0–36.0)
Platelets: 29 10*3/uL — CL (ref 150–400)
RDW: 16.8 % — ABNORMAL HIGH (ref 11.5–15.5)
WBC: 10.7 10*3/uL — ABNORMAL HIGH (ref 4.0–10.5)

## 2012-03-13 LAB — CULTURE, BLOOD (ROUTINE X 2): Culture: NO GROWTH

## 2012-03-13 MED ORDER — MAGIC MOUTHWASH W/LIDOCAINE
10.0000 mL | Freq: Every day | ORAL | Status: DC
  Filled 2012-03-13 (×5): qty 10

## 2012-03-13 MED ORDER — MAGIC MOUTHWASH W/LIDOCAINE
10.0000 mL | Freq: Every day | ORAL | Status: DC
  Administered 2012-03-13 – 2012-03-20 (×29): 10 mL via ORAL
  Filled 2012-03-13 (×41): qty 10

## 2012-03-13 MED ORDER — BIOTENE DRY MOUTH MT LIQD
15.0000 mL | Freq: Two times a day (BID) | OROMUCOSAL | Status: DC
  Administered 2012-03-14 – 2012-03-20 (×12): 15 mL via OROMUCOSAL

## 2012-03-13 NOTE — Progress Notes (Signed)
Follow-up telephone call placed to patient's significant other Soyla Dryer ([phone]); he informed patient's sister has her own health issues and is unable to come to [facility]; however patient's eldest daughter will be available tomorrow for PMT meeting - meeting confirmed for tomorrow, Thursday, 03/14/12 @ 1:30 pm   Valente David, RN 03/13/2012, 11:28 AM Palliative Medicine Team RN Liaison [phone]

## 2012-03-13 NOTE — Progress Notes (Signed)
CRITICAL VALUE ALERT  Critical value received:  Platelet 29  Date of notification:  03/13/2012   Time of notification:  6:41 PM   Critical value read back:yes  Nurse who received alert:  Armanda Heritage   MD notified (1st page):  Dr Elisabeth Pigeon  Time of first page:  6:41 PM   MD notified (2nd page):  Time of second page:  Responding MD:  Dr Elisabeth Pigeon  Time MD responded:  6:43 PM

## 2012-03-13 NOTE — Progress Notes (Signed)
[facility] PROGRESS NOTE  Delesa Kawa ZOX:[id number] DOB: [birth year] DOA: 03/06/2012 PCP: Sheila Oats, MD  Brief narrative:  Vicki Sanders is a 46 year old female with a past medical history of stage IV breast cancer, widely metastatic, who began chemotherapy last week with last dose given 03/05/2012 presented to the hospital on 03/06/2012 a chief complaint of fatigue, poor oral intake, and right upper quadrant abdominal pain. Upon initial evaluation emergency department, the patient was found to have transaminitis. An abdominal ultrasound was done which showed a normal bile duct. Due to significant chemo related toxicity, and a complicated home situation awaiting palliative care meeting for goals of care.  Assessment/Plan:   Principal Problem:  *Breast cancer metastasized to bone with fatigue, poor oral intake, and failure to thrive status post chemotherapy  Diagnosed 02/14/2012. First chemotherapy with Taxotere and carboplatinum given 02/29/2012 with subsequent dose given 03/05/2012. Also on Herceptin.  Next Herceptin dose due on 03/12/12. Will follow up with oncology in regards to further chemotherapy options PCT to meet for goals of care Dietitian, physical therapy, and occupational therapy evaluations performed. SNF recommended. Active Problems:  Abdominal pain  Unclear etiology, lower abdomen. Urine culture negative for UTI.  CT abdomen and pelvis done 03/10/12, no acute findings.  Diarrhea improving.  Pain control improved with medication adjustment. Epistaxis secondary to thrombocytopenia  Off Lovenox as of 03/10/12.  SCDs for DVT prophylaxis.  Nasal saline PRN.  No further episodes. Mucositis (ulcerative) due to antineoplastic therapy  Continue Carafate and Magic Mouthwash Q 4 hours. Neutropenic fever  Status post Neulasta support as an outpatient.  Unclear etiology of fever, but put on empiric Cipro for suspected urinary tract infection, which was discontinued on 03/08/12 after  urine culture proved negative.  Blood and urine cultures sent, negative to date.  Stool for Clostridium difficile sent, negative.  Empiric antibiotics continued through 03/08/12, discontinued with negative cultures, no return of fever. Urinary tract infection  Urinalysis reviewed and was positive for nitrites, 3-6 white blood cells, and a few bacteria.  Urine cultures sent, negative.  Initially on empiric Cipro which was stopped 03/08/12. Pancytopenia due to chemotherapy  White blood cell count normalized.  Platelets and hemoglobin relatively stable, but continue to trend down.  Continue to monitor closely. Transaminitis likely due to liver metastasis  Liver ultrasound consistent with metastatic disease.  No evidence of common bile duct obstruction. No indication for stenting at this time.  LFTs trending down. Hyponatremia  May be related to dehydration versus SIADH in the setting of known malignancy.  Normalized with IVF. DM (diabetes mellitus), type 2, uncontrolled with complications Hemoglobin A1c indicative of poorly controlled diabetes.  The patient states she's been on diabetes medicines in the past, but stopped these due to inability to afford them.  Placed on moderate scale sliding scale insulin 03/07/12. CBGs 90-150 over past 24 hours.  Seen by Diabetes coordinator on 03/07/12.  Continue carbohydrate modified diet.  Patient reports that she has taken insulin in the past, and knows how to give herself insulin shots, so will likely d/c her on insulin. Acute kidney injury  Baseline creatinine 0.63 on 02/20/2012. Current creatinine elevated over her usual baseline values, and likely due to a prerenal etiology.  Continue to hydrate and monitor creatinine, which is slowly improving. BMP in am Severe Protein calorie malnutrition / Obesity BMI 35.2  Poor protein stores in the setting of decreased oral intake secondary to cancer.  Dietitian consultation performed 03/07/12.  Code Status: Full    Family Communication: Boyfriend,  Kandis Mannan, updated at bedside 03/11/12.  Disposition Plan: SNF placement  Medical Consultants:  Dr. Drue Second, Oncology  Palliative Care Team Other consultants:  Diabetes coordinator  Dietitian  Physical therapy:SNF recommended. Antibiotics:  Cipro 03/06/2012--->03/08/12  Flagyl 03/07/12--->03/08/12  Manson Passey, MD  [facility] Pager [phone]  If 7PM-7AM, please contact night-coverage [email or web address] Password [facility] 03/13/2012, 5:23 PM   LOS: 7 days   HPI/Subjective: No acute events overnight. Per RN report patient recently today passed clot per rectum.  Objective: Filed Vitals:   03/12/12 1523 03/12/12 2111 03/13/12 0600 03/13/12 1424  BP: 106/68 97/54 112/61 107/65  Pulse: 93 94 95 98  Temp: 97.9 F (36.6 C) 97.2 F (36.2 C) 97.3 F (36.3 C) 98.3 F (36.8 C)  TempSrc: Oral Oral Oral Oral  Resp: 18 17 17 18   Height:      Weight:      SpO2: 96% 97% 96% 98%    Intake/Output Summary (Last 24 hours) at 03/13/12 1723 Last data filed at 03/13/12 1433  Gross per 24 hour  Intake   1921 ml  Output      0 ml  Net   1921 ml    Exam:   General:  Pt is alert, follows commands appropriately, not in acute distress  Cardiovascular: Regular rate and rhythm, S1/S2, no murmurs, no rubs, no gallops  Respiratory: Clear to auscultation bilaterally, no wheezing, no crackles, no rhonchi  Abdomen: Soft, non tender, non distended, bowel sounds present, no guarding  Extremities: No edema, pulses DP and PT palpable bilaterally  Neuro: Grossly nonfocal  Data Reviewed: Basic Metabolic Panel:  Lab 03/11/12 1478 03/10/12 0705 03/09/12 0610 03/08/12 0550 03/07/12 0400  NA 135 136 135 133* 130*  K 3.9 4.0 3.9 3.5 4.0  CL 107 108 107 101 97  CO2 19 20 20 20 21   GLUCOSE 111* 98 101* 148* 186*  BUN 27* 30* 36* 38* 34*  CREATININE 0.85 0.93 1.12* 1.25* 1.20*  CALCIUM 7.8* 7.7* 7.7* 7.5* 7.5*   Liver Function Tests:  Lab 03/08/12 0550  03/07/12 0400  AST 159* 170*  ALT 55* 64*  ALKPHOS 433* 507*  BILITOT 12.3* 14.7*  PROT 4.8* 4.6*  ALBUMIN 1.4* 1.4*   CBC:  Lab 03/12/12 0700 03/11/12 0555 03/10/12 0705 03/09/12 0610 03/08/12 0550  WBC 9.7 9.4 8.4 8.3 5.6  HGB 8.0* 8.3* 8.6* 8.7* 9.0*  HCT 25.6* 26.1* 26.8* 27.2* 27.5*  MCV 90.5 89.4 89.0 88.3 88.7  PLT 34* 38* 50* 63* 78*   CBG:  Lab 03/13/12 1206 03/13/12 0802 03/12/12 2109 03/12/12 1748 03/12/12 1206  GLUCAP 184* 170* 181* 167* 151*    Recent Results (from the past 240 hour(s))  URINE CULTURE     Status: Normal   Collection Time   03/06/12  6:44 PM      Component Value Range Status Comment   Specimen Description URINE, CATH  Final    Special Requests NONE   Final    Culture  Setup Time 03/07/2012 02:02   Final    Colony Count NO GROWTH   Final    Culture NO GROWTH   Final    Report Status 03/08/2012 FINAL   Final   CULTURE, BLOOD (ROUTINE X 2)     Status: Normal   Collection Time   03/06/12  8:00 PM      Component Value Range Status Comment   Culture NO GROWTH 5 DAYS   Final    Report Status 03/13/2012 FINAL  Final   CULTURE, BLOOD (ROUTINE X 2)     Status: Normal   Collection Time   03/06/12  8:15 PM      Component Value Range Status Comment   Culture NO GROWTH 5 DAYS   Final    Report Status 03/13/2012 FINAL   Final   CLOSTRIDIUM DIFFICILE BY PCR     Status: Normal   Collection Time   03/07/12  6:47 AM      Component Value Range Status Comment   C difficile by pcr NEGATIVE  NEGATIVE Final      Studies: No results found.  Scheduled Meds:   . fentaNYL  100 mcg Transdermal Once  . fentaNYL  112.5 mcg Transdermal Q72H  . fentaNYL  12.5 mcg Transdermal Once  . insulin aspart  0-15 Units Subcutaneous TID WC  . insulin aspart  0-5 Units Subcutaneous QHS  . pantoprazole  40 mg Oral Q1200  . sucralfate  1 g Oral TID WC & HS  . vitamin A & D       Continuous Infusions:   . 0.9 % NaCl with KCl 20 mEq / L 20 mL/hr at 03/12/12 1610

## 2012-03-13 NOTE — Progress Notes (Signed)
CSW spoke with the patient about her available bed offers. The patient is looking to choose a bed for placement. The patient is aware that facilities are not available in her immediate area and needs further consultation as to if placement at home is the best options. CSW also spoke with the patients daughter, [name], [phone] or [phone]. The daughter wanted to talk about power of [employer] Friends paper work. Prior to 1:30 tomorrow the daughter will come and consult about that information. The daughter also also consulted information related to custody issues, however, no advice could be given.   Kayleen Memos. Leighton Ruff [phone]

## 2012-03-14 DIAGNOSIS — R531 Weakness: Secondary | ICD-10-CM | POA: Diagnosis present

## 2012-03-14 DIAGNOSIS — N179 Acute kidney failure, unspecified: Secondary | ICD-10-CM

## 2012-03-14 DIAGNOSIS — F419 Anxiety disorder, unspecified: Secondary | ICD-10-CM | POA: Diagnosis present

## 2012-03-14 DIAGNOSIS — R112 Nausea with vomiting, unspecified: Secondary | ICD-10-CM | POA: Diagnosis present

## 2012-03-14 DIAGNOSIS — R1011 Right upper quadrant pain: Secondary | ICD-10-CM | POA: Diagnosis present

## 2012-03-14 LAB — CBC
Hemoglobin: 7.1 g/dL — ABNORMAL LOW (ref 12.0–15.0)
MCH: 28.9 pg (ref 26.0–34.0)
Platelets: 27 10*3/uL — CL (ref 150–400)
RBC: 2.46 MIL/uL — ABNORMAL LOW (ref 3.87–5.11)
WBC: 11 10*3/uL — ABNORMAL HIGH (ref 4.0–10.5)

## 2012-03-14 LAB — COMPREHENSIVE METABOLIC PANEL
Albumin: 1.4 g/dL — ABNORMAL LOW (ref 3.5–5.2)
Alkaline Phosphatase: 422 U/L — ABNORMAL HIGH (ref 39–117)
BUN: 33 mg/dL — ABNORMAL HIGH (ref 6–23)
Potassium: 3.8 mEq/L (ref 3.5–5.1)
Sodium: 133 mEq/L — ABNORMAL LOW (ref 135–145)
Total Protein: 4.7 g/dL — ABNORMAL LOW (ref 6.0–8.3)

## 2012-03-14 LAB — GLUCOSE, CAPILLARY
Glucose-Capillary: 136 mg/dL — ABNORMAL HIGH (ref 70–99)
Glucose-Capillary: 163 mg/dL — ABNORMAL HIGH (ref 70–99)
Glucose-Capillary: 123 mg/dL — ABNORMAL HIGH (ref 70–99)

## 2012-03-14 LAB — FIBRINOGEN: Fibrinogen: 519 mg/dL — ABNORMAL HIGH (ref 204–475)

## 2012-03-14 LAB — PROTIME-INR: Prothrombin Time: 15.7 seconds — ABNORMAL HIGH (ref 11.6–15.2)

## 2012-03-14 MED ORDER — ONDANSETRON HCL 4 MG/2ML IJ SOLN
4.0000 mg | Freq: Three times a day (TID) | INTRAMUSCULAR | Status: DC
  Administered 2012-03-15 – 2012-03-19 (×7): 4 mg via INTRAVENOUS
  Filled 2012-03-14 (×7): qty 2

## 2012-03-14 MED ORDER — PROCHLORPERAZINE EDISYLATE 5 MG/ML IJ SOLN
10.0000 mg | Freq: Four times a day (QID) | INTRAMUSCULAR | Status: DC | PRN
  Filled 2012-03-14: qty 2

## 2012-03-14 MED ORDER — ACETAMINOPHEN 325 MG PO TABS
650.0000 mg | ORAL_TABLET | Freq: Once | ORAL | Status: AC
  Administered 2012-03-14: 650 mg via ORAL
  Filled 2012-03-14: qty 2

## 2012-03-14 MED ORDER — OXYCODONE HCL 5 MG PO TABS
5.0000 mg | ORAL_TABLET | ORAL | Status: DC | PRN
  Administered 2012-03-20: 5 mg via ORAL
  Filled 2012-03-14: qty 1

## 2012-03-14 MED ORDER — ONDANSETRON HCL 4 MG PO TABS
4.0000 mg | ORAL_TABLET | Freq: Three times a day (TID) | ORAL | Status: DC
  Administered 2012-03-14 – 2012-03-20 (×9): 4 mg via ORAL
  Filled 2012-03-14 (×22): qty 1

## 2012-03-14 MED ORDER — DIPHENHYDRAMINE HCL 25 MG PO CAPS
25.0000 mg | ORAL_CAPSULE | Freq: Once | ORAL | Status: AC
  Administered 2012-03-14: 25 mg via ORAL
  Filled 2012-03-14: qty 1

## 2012-03-14 MED ORDER — MORPHINE SULFATE 2 MG/ML IJ SOLN
2.0000 mg | INTRAMUSCULAR | Status: DC | PRN
  Administered 2012-03-14 – 2012-03-19 (×4): 2 mg via INTRAVENOUS
  Filled 2012-03-14 (×4): qty 1

## 2012-03-14 NOTE — Progress Notes (Signed)
RN received report that blood was noted on patients sheets during linen change, along with one medium sizes blood clot.

## 2012-03-14 NOTE — Progress Notes (Signed)
Subjective: Events since 7/7 noted. RUQ abdominal pain, better controlled with fentanyl patch. Bloating noted, not a new symptom.  Her H/H still low at 7.1/22.1, and her platelets are now 28 k  Following  an episode of  Epistaxis for wich  Lovenox was discontinued on 03/10/2012, resolving bleeding issues (last chemotherapy was on 7/2 with Herceptin and was due for chemo on 7/9 which had to be placed on hold). On 7/11 she noted a vaginal clot without recurrence. She believes her period is about to start, since it has been present every 3 or 4 weeks. She had developed  mucositis, treated with Carafate and Magic mouthwash with improvement of symptoms. She continues to have decreased oral intake with dietitians involved to help her improve her oral consumption. Diarrhea was noted on 7/8 with negative C diff. And now resolved.  Bed placement is being sought, patient is evaluating facilities available. Social Work is involved in the process. She remains Full Code.  Objective: Vital signs in last 24 hours: BP 118/58  Pulse 82  Temp 98.1 F (36.7 C) (Oral)  Resp 17  Ht 5\' 4"  (1.626 m)  Wt 206 lb 9.1 oz (93.7 kg)  BMI 35.46 kg/m2  SpO2 96%  LMP 03/02/2012  Physical Exam: 46 y.o.  in no acute distress  A. and O. X3. Ill appearing HEENT: Sclera  Mildly icteric. Oral cavity without thrush or lesions. Neck supple.no cervical or supraclavicular adenopathy  Lungs: CTA. No wheezing, rhonchi or rales. No axillary masses. CV regular rate and rhythm normal S1-S2, no murmur , rubs or gallops Abdomen obese,  soft diffusely tender, especially on the RUQ , bowel sounds x4   Breasts: R breast mass palpable, known, no increase on size.  GU/rectal: deferred. Extremities: no clubbing cyanosis .1 + edema on the lower extremities. Puffy edema on the hands. No bruising or petechial rash Neurologic: non focal    Lab Results: CBC   Lab 03/14/12 0434 03/13/12 1725 03/12/12 0700 03/11/12 0555 03/10/12 0705  WBC  11.0* 10.7* 9.7 9.4 8.4  HGB 7.1* 8.0* 8.0* 8.3* 8.6*  HCT 22.1* 24.8* 25.6* 26.1* 26.8*  PLT 27* 29* 34* 38* 50*  MCV 89.8 88.9 90.5 89.4 89.0  MCH 28.9 28.7 28.3 28.4 28.6  MCHC 32.1 32.3 31.3 31.8 32.1  RDW 16.7* 16.8* 16.7* 16.7* 16.6*  LYMPHSABS -- -- -- -- --  MONOABS -- -- -- -- --  EOSABS -- -- -- -- --  BASOSABS -- -- -- -- --  BANDABS -- -- -- -- --    CMP    Lab 03/11/12 0555 03/10/12 0705 03/09/12 0610 03/08/12 0550  NA 135 136 135 133*  K 3.9 4.0 3.9 3.5  CL 107 108 107 101  CO2 19 20 20 20   GLUCOSE 111* 98 101* 148*  BUN 27* 30* 36* 38*  CREATININE 0.85 0.93 1.12* 1.25*  CALCIUM 7.8* 7.7* 7.7* 7.5*  MG -- -- -- --  AST -- -- -- 159*  ALT -- -- -- 55*  ALKPHOS -- -- -- 433*  BILITOT -- -- -- 12.3*        Component Value Date/Time   BILITOT 12.3* 03/08/2012 0550   BILIDIR 0.2 01/22/2012 0923   IBILI 0.4 01/22/2012 1610      Assessment/Plan:  46 y.o. 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 First chemotherapy with Taxotere and carboplatinum given 02/29/2012 with subsequent dose of Herceptin on 7/2. Herceptin scheduled for 7/9 was  not adminisered due multiple medical issues mentioned above, including significant chemo toxicity as well as complicated home situation. At this time, Palliative Care  Is involved and patient is evaluating placement. Family meeting is to take place today at 1:30 pm.   #2 neutropenia secondary to recent chemotherapy this is now resolved since patient  receive Neulasta.   #3: Anemia, multifactorial, including recent episode of acute epistaxis. No further bleeding issues. Will continue to monitor. Would hold transfusion for now.   #4: Thrombocytopenia in the setting of metastatic liver disease, polypharmacy, dilution ,  suspected infection and  and recent chemo on 7/2. Lovenox d/c's due to episode of epistaxis. Currently platelets at 27 k. Continue to monitor. SCD for DVT prophylaxis  #5  failure to thrive : Appreciate Nutrition evaluation and follow up.   #6. Abdominal pain, still present, better controlled with meds. Appreciate IM follow up.   #6 deconditioning patient is being seen by physical therapy encourage ambulation.   #7 Transamminitis secondary to  to underlying widely metastatic breast cancer to the liver. Her last US liver was negative for CBD obstruction, without indication of stenting.  #8. Oral mucositis, improved with Magic Mouthwash and Carafate, followed by admitting team.    #9. Other medical issues, including Hyponatremia, DM, Acute kidney injury, UTI as per IM Team. Appreciate the care provided to this nice patient.   #7 patient is a full code. Her overall prognosis remains guarded.        LOS: 8 days   [facility] E 03/14/2012, 7:53 AM  ATTENDING:  Patient seen and examined chart and labs reviewed. Patient and family meeting 2 discussed goals of care. They are meeting with palliative care team as well.  I met with the patient as well as her daughter. We discussed the overall prognosis. 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 I do think that we should try to give her 1-2 complete cycles and then do a reevaluation with scans to see if there is any kind of response to the therapy. I have made this very clear to the patient as well as her daughter and they concur with my plan.  Recommendations   #1anemia: Most likely due to recent chemotherapy however her blood counts should be coming up by now as her white count has come up. I would like to get a liver functions tests total bilirubin LDH reticulocyte count as well as haptoglobin to make sure patient does not have TTP or DIC. I will check a PT and PTT on her as well.  #2 patient should be transfused for symptomatic anemia if she has no evidence of TTP.  Drue Second,  MD Medical/Oncology [facility] [phone] (beeper) [phone] (Office)  03/14/2012, 3:15 PM

## 2012-03-14 NOTE — Progress Notes (Signed)
Nutrition Follow-up  Intervention: Hopefully as medications help pt's nausea and mucositis, pt's intake will continue to improve.   Assessment:  Pt had palliative care meeting today. NP adjusting medication to help with pt's intermittent nausea and mucositis. Intake improved to 50-75% of meals.   Diet Order: CHO modified medium  Meds: Scheduled Meds:   . acetaminophen  650 mg Oral Once  . antiseptic oral rinse  15 mL Mouth Rinse q12n4p  . diphenhydrAMINE  25 mg Oral Once  . fentaNYL  112.5 mcg Transdermal Q72H  . insulin aspart  0-15 Units Subcutaneous TID WC  . insulin aspart  0-5 Units Subcutaneous QHS  . magic mouthwash w/lidocaine  10 mL Oral 5 X Daily  . ondansetron  4 mg Intravenous TID   Or  . ondansetron  4 mg Oral TID  . pantoprazole  40 mg Oral Q1200  . sucralfate  1 g Oral TID WC & HS   Continuous Infusions:   . 0.9 % NaCl with KCl 20 mEq / L 20 mL/hr at 03/14/12 0728   PRN Meds:.alum & mag hydroxide-simeth, diazepam, lidocaine-prilocaine, morphine injection, oxyCODONE, prochlorperazine, sodium chloride, zolpidem, DISCONTD:  morphine injection, DISCONTD: ondansetron (ZOFRAN) IV, DISCONTD: ondansetron, DISCONTD: oxyCODONE-acetaminophen  Labs:  CMP     Component Value Date/Time   NA 133* 03/14/2012 1551   K 3.8 03/14/2012 1551   CL 105 03/14/2012 1551   CO2 19 03/14/2012 1551   GLUCOSE 166* 03/14/2012 1551   BUN 33* 03/14/2012 1551   CREATININE 1.06 03/14/2012 1551   CALCIUM 8.2* 03/14/2012 1551   PROT 4.7* 03/14/2012 1551   ALBUMIN 1.4* 03/14/2012 1551   AST 202* 03/14/2012 1551   ALT 65* 03/14/2012 1551   ALKPHOS 422* 03/14/2012 1551   BILITOT 10.1* 03/14/2012 1551   GFRNONAA 62* 03/14/2012 1551   GFRAA 72* 03/14/2012 1551     Intake/Output Summary (Last 24 hours) at 03/14/12 1745 Last data filed at 03/14/12 1327  Gross per 24 hour  Intake  912.1 ml  Output      0 ml  Net  912.1 ml   Last BM - 03/13/12  Weight Status: No new weights   Re-estimated needs: No  changes. 1650-1950 calories 65-85g protein  Nutrition Dx: Inadequate oral intake - improved   Goal: Pt to consume >90% of meals - not met   Monitor: Weights, labs, intake, nausea, mucositis, BM  Marshall Cork Pager: [phone]

## 2012-03-14 NOTE — Consult Note (Signed)
Consult Note from the Palliative Medicine Team at [facility] Patient Vicki Sanders      DOB: 1965/11/21      AVW:[id number]   Consult Requested by: Dr Rama                                                    PCP: Sheila Oats, MD Reason for Consultation:Goals of Care/Symptom Management     Phone Number:[phone]   Reviewed chart, spoke with staff and Dr Welton Flakes from oncology prior to completion of family meeting. In attendance with patients permission,were patients daughter, Mora Bellman [facility]), Kandis Mannan (significant other, lives with patient), [place] and Trula Ore (friends of the patient). Began conversation with what patient knows regarding her diagnosis of breast cancer. She realizes that her cancer is a Stage 4, which she stated it has spread to other organs and areas of her body, and that it is not curable. She had some questions pertaining to receptor status and what that means as far as treatment options, her quality of life on and off treatment,as well as causes of her weakness and nausea. She stated Dr Park Breed informed her there was a slight response to her initial treatment. Patient would like to continue treatment if her condition stabilizes. Had concerns also about her future ability to care for 45 year old daughter Vicki Sanders, currently her friend Trula Ore is caring for her, and consideration of daughter Vicki Sanders obtaining custody and how to do that was also mentioned. Abigail SW from cancer center in meeting made some recommendations pertinent to custody concerns, also gave family information on [employer] support for [place]. Father of Vicki Sanders share limited visiting rights, but ongoing support by him is not an issue due to mental health issues.  We began to discuss patient wishes regarding cardiac/pulmonary resuscitation wishes, agresiveness of medical interventions, use of antibiotics, and use of ongoing hydration and use of artificial feeding tubes. Patient became nauseous and began having abdominal  pain, became drowsy after prn medications. Daughter requested to review form and read the Hard Choices booklet, and would like to re-meet so all topics can be discussed with patient being able to actively participate. Vicki Sanders stated she would call PMT phone to schedule a re-meeting in next few days. I also spoke with Vicki Sanders about the availability of hospice services if mother decides to stop treatment and opt for comfort measures, and that she can call any hospice agency to have her evaluated for services.   Assessment and Plan: 1. Code Status: Full 2. Symptom Control: -Nausea: patient admits to intermittent nausea and occassionalbouts of emesis(1-2 x per day) , currently on Zofran 4 mg IV as needed, ordered Zofran 4 mg PO/IV every 8 hrs scheduled, with Compazine 10 mg IV every 6 hrs as needed. -Pain: Fentanyl patch recently increased to 112.5 mcg/every 72 hrs, patient stated this does control pain. Does experience breakthrough pain if abdomen is manipulated or with activity. On Morphine IV 2mg  every 2 hrs(used 2mg /24 hrs) and Percocet 2 tabs every 3 hours (no use in 24 hrs). Changed Percocet to Oxycodone 5-10 mg every 4 hrs as needed, due to elevated LFT's.  -Mucositis: s/p chemotherapy induced, patient states it's improving still limits oral intake. Instructed patient to ask for oral pain med 30 minutes before meals to limit discomfort. -Anxiety: uses valium 5 mg as needed 1-2 x per day  which is effective -Insomnia: currently on Ambien, which is effective  3. Psycho/Social: Emotional support to patient, family and friends 4. Spiritual: consult placed 5. Disposition: Plans not solidified yet, either will be SNF placement in [place] or [place], or to daughter Vicki Sanders's  Patient Documents Completed or Given: Document Given Completed  Advanced Directives Pkt  YES  MOST    DNR  No  Gone from My Sight    Hard Choices  YES    Brief HPI: 46 year old WF with history of Stg IV breast cancer (HER2 +)  diagnosed in 02/2012, completed one cycle of Taxotere/Carbo 6/13, and Herceptin 7/2. Presented to ER c/o fatigue, n/v, R upper abdominal pain and poor oral intake   ROS: +abdominal pain, + mouth discomfort,+nausea/vomiting, +anxiety, +insomnia    PMH:  Past Medical History  Diagnosis Date  . Diabetes mellitus   . Arthritis   . Cancer 01/23/2012     PSH: Past Surgical History  Procedure Date  . Shoulder arthrotomy   . Tonsillectomy   . Appendectomy   . Cesarean section    I have reviewed the FH and SH and  If appropriate update it with new information. Allergies  Allergen Reactions  . Cephalosporins Hives and Itching   Scheduled Meds:   . acetaminophen  650 mg Oral Once  . antiseptic oral rinse  15 mL Mouth Rinse q12n4p  . diphenhydrAMINE  25 mg Oral Once  . fentaNYL  112.5 mcg Transdermal Q72H  . insulin aspart  0-15 Units Subcutaneous TID WC  . insulin aspart  0-5 Units Subcutaneous QHS  . magic mouthwash w/lidocaine  10 mL Oral 5 X Daily  . pantoprazole  40 mg Oral Q1200  . sucralfate  1 g Oral TID WC & HS   Continuous Infusions:   . 0.9 % NaCl with KCl 20 mEq / L 20 mL/hr at 03/14/12 0728   PRN Meds:.alum & mag hydroxide-simeth, diazepam, lidocaine-prilocaine, morphine injection, ondansetron (ZOFRAN) IV, ondansetron, oxyCODONE-acetaminophen, sodium chloride, zolpidem, DISCONTD:  morphine injection    BP 98/61  Pulse 96  Temp 98 F (36.7 C) (Oral)  Resp 18  Ht 5\' 4"  (1.626 m)  Wt 93.7 kg (206 lb 9.1 oz)  BMI 35.46 kg/m2  SpO2 96%  LMP 03/02/2012   PPS: 40%   Intake/Output Summary (Last 24 hours) at 03/14/12 1539 Last data filed at 03/14/12 1327  Gross per 24 hour  Intake  912.1 ml  Output      0 ml  Net  912.1 ml   LBM:7/10                       Stool Softner: none ordered patient had diarrhea on admission and during recent hospital course   Physical Exam:  General: Ill looking middle aged WF, arousable to verbal stimuli HEENT: mouth beefy  red, no signs of oral candidiasis Chest:  CTA, diminished at bases CVS: RRR, no MGR Abdomen:soft, pain illicited in upper R quad with palpation Ext: no edema Neuro:oreinted x 3  Labs: CBC    Component Value Date/Time   WBC 11.0* 03/14/2012 0434   WBC 1.2* 03/05/2012 1533   RBC 2.46* 03/14/2012 0434   RBC 3.65* 03/05/2012 1533   HGB 7.1* 03/14/2012 0434   HGB 10.5* 03/05/2012 1533   HCT 22.1* 03/14/2012 0434   HCT 31.8* 03/05/2012 1533   PLT 27* 03/14/2012 0434   PLT 103* 03/05/2012 1533   MCV 89.8 03/14/2012 0434  MCV 87.1 03/05/2012 1533   MCH 28.9 03/14/2012 0434   MCH 28.8 03/05/2012 1533   MCHC 32.1 03/14/2012 0434   MCHC 33.0 03/05/2012 1533   RDW 16.7* 03/14/2012 0434   RDW 15.4* 03/05/2012 1533   LYMPHSABS 0.5* 03/06/2012 1535   LYMPHSABS 0.6* 03/05/2012 1533   MONOABS 0.6 03/06/2012 1535   MONOABS 0.2 03/05/2012 1533   EOSABS 0.0 03/06/2012 1535   EOSABS 0.0 03/05/2012 1533   BASOSABS 0.0 03/06/2012 1535   BASOSABS 0.0 03/05/2012 1533    BMET    Component Value Date/Time   NA 135 03/11/2012 0555   K 3.9 03/11/2012 0555   CL 107 03/11/2012 0555   CO2 19 03/11/2012 0555   GLUCOSE 111* 03/11/2012 0555   BUN 27* 03/11/2012 0555   CREATININE 0.85 03/11/2012 0555   CALCIUM 7.8* 03/11/2012 0555   GFRNONAA 82* 03/11/2012 0555   GFRAA >90 03/11/2012 0555    CMP     Component Value Date/Time   NA 135 03/11/2012 0555   K 3.9 03/11/2012 0555   CL 107 03/11/2012 0555   CO2 19 03/11/2012 0555   GLUCOSE 111* 03/11/2012 0555   BUN 27* 03/11/2012 0555   CREATININE 0.85 03/11/2012 0555   CALCIUM 7.8* 03/11/2012 0555   PROT 4.8* 03/08/2012 0550   ALBUMIN 1.4* 03/08/2012 0550   AST 159* 03/08/2012 0550   ALT 55* 03/08/2012 0550   ALKPHOS 433* 03/08/2012 0550   BILITOT 12.3* 03/08/2012 0550   GFRNONAA 82* 03/11/2012 0555   GFRAA >90 03/11/2012 0555   Time In Time Out Total Time Spent with Patient Total Overall Time  1:30p 3:30p 90 min 120 min    Greater than 50%  of this time was spent counseling and coordinating care related to the above  assessment and plan.  Freddie Breech, CNS-C Palliative Medicine Team [facility] Health Team Phone: [phone] Pager: [phone]

## 2012-03-14 NOTE — Progress Notes (Signed)
PT Cancellation Note  ___Treatment cancelled today due to medical issues with patient which prohibited   therapy  ___ Treatment cancelled today due to patient receiving procedure or test   ___ Treatment cancelled today due to patient's refusal to participate   _x__ Treatment cancelled today due tomeeting w/ PC for goals of tx pending.

## 2012-03-14 NOTE — Progress Notes (Signed)
Patient ID: Vicki Sanders, female   DOB: Sep 05, 1965, 47 y.o.   MRN: [id number] [facility] PROGRESS NOTE  Dhana Totton QMV:[id number] DOB: 07/11/1966 DOA: 03/06/2012 PCP: Sheila Oats, MD  Brief narrative:  Vicki Sanders is a 46 year old female with a past medical history of stage IV breast cancer, widely metastatic, who began chemotherapy last week with last dose given 03/05/2012 presented to the hospital on 03/06/2012 a chief complaint of fatigue, poor oral intake, and right upper quadrant abdominal pain. Upon initial evaluation emergency department, the patient was found to have transaminitis. An abdominal ultrasound was done which showed a normal bile duct. Due to significant chemo related toxicity, and a complicated home situation awaiting palliative care meeting for goals of care.   Assessment/Plan:   Principal Problem:  *Breast cancer metastasized to bone with fatigue, poor oral intake, and failure to thrive status post chemotherapy  Diagnosed 02/14/2012. First chemotherapy with Taxotere and carboplatinum given 02/29/2012 with subsequent dose given 03/05/2012. Also on Herceptin.  Next Herceptin dose due on 03/12/12. Will follow up with oncology in regards to further chemotherapy options  PCT to meet for goals of care  Dietitian, physical therapy, and occupational therapy evaluations performed. SNF recommended.  Active Problems:  Acute blood loss anemia  patient passed large blood clot per rectum today  Hemoglobin 7.1 today  Will transfuse 1 unit PRBC today  Follow up post transfusion CBC  Abdominal pain  Unclear etiology, lower abdomen. Urine culture negative for UTI.  CT abdomen and pelvis done 03/10/12, no acute findings.  Diarrhea resolved Pain control improved with medication adjustment. Epistaxis secondary to thrombocytopenia  Off Lovenox as of 03/10/12.  SCDs for DVT prophylaxis.  Nasal saline PRN.  No further episodes. Mucositis (ulcerative) due to antineoplastic  therapy  Continue Carafate and Magic Mouthwash Q 4 hours. Neutropenic fever  Status post Neulasta support as an outpatient.  Unclear etiology of fever, but put on empiric Cipro for suspected urinary tract infection, which was discontinued on 03/08/12 after urine culture proved negative.  Blood and urine cultures sent, negative to date.  Stool for Clostridium difficile sent, negative.  Empiric antibiotics continued through 03/08/12, discontinued with negative cultures, no return of fever. Urinary tract infection  Urinalysis reviewed and was positive for nitrites, 3-6 white blood cells, and a few bacteria.  Urine cultures sent, negative.  Initially on empiric Cipro which was stopped 03/08/12. Pancytopenia due to chemotherapy  White blood cell count normalized.  Platelets and hemoglobin relatively stable, but continue to trend down.  Continue to monitor closely. Transaminitis likely due to liver metastasis  Liver ultrasound consistent with metastatic disease.  No evidence of common bile duct obstruction. No indication for stenting at this time.  LFTs trending down. Hyponatremia  May be related to dehydration versus SIADH in the setting of known malignancy.  Normalized with IVF. DM (diabetes mellitus), type 2, uncontrolled with complications  Hemoglobin A1c indicative of poorly controlled diabetes.  The patient states she's been on diabetes medicines in the past, but stopped these due to inability to afford them.  Placed on moderate scale sliding scale insulin 03/07/12. CBGs 90-150 over past 24 hours.  Seen by Diabetes coordinator on 03/07/12.  Continue carbohydrate modified diet.  Patient reports that she has taken insulin in the past, and knows how to give herself insulin shots, so will likely d/c her on insulin. Acute kidney injury  Baseline creatinine 0.63 on 02/20/2012. Current creatinine elevated over her usual baseline values, and likely due to a prerenal etiology.  Continue to hydrate and  monitor creatinine Creatinine is within normal limits now Severe Protein calorie malnutrition / Obesity BMI 35.2  Poor protein stores in the setting of decreased oral intake secondary to cancer.  Dietitian consultation performed 03/07/12.  Code Status: Full  Family Communication: Boyfriend, Kandis Mannan, updated at bedside 03/11/12.  Disposition Plan: SNF placement   Medical Consultants:  Dr. Drue Second, Oncology  Palliative Care Team Other consultants:  Diabetes coordinator  Dietitian  Physical therapy:SNF recommended. Antibiotics:  Cipro 03/06/2012--->03/08/12  Flagyl 03/07/12--->03/08/12  Manson Passey, MD  [facility] Pager [phone]  If 7PM-7AM, please contact night-coverage [email or web address] Password [email or web address] 03/14/2012, 2:01 PM   LOS: 8 days   HPI/Subjective: No acute events overnight.  Objective: Filed Vitals:   03/13/12 0600 03/13/12 1424 03/13/12 2201 03/14/12 0645  BP: 112/61 107/65 101/66 118/58  Pulse: 95 98 94 82  Temp: 97.3 F (36.3 C) 98.3 F (36.8 C) 98.1 F (36.7 C) 98.1 F (36.7 C)  TempSrc: Oral Oral Oral Oral  Resp: 17 18 17 17   Height:      Weight:      SpO2: 96% 98% 96% 96%    Intake/Output Summary (Last 24 hours) at 03/14/12 1401 Last data filed at 03/14/12 1327  Gross per 24 hour  Intake 2293.1 ml  Output      0 ml  Net 2293.1 ml    Exam:   General:  Pt is alert, follows commands appropriately, not in acute distress; jaundiced  Cardiovascular: Regular rate and rhythm, S1/S2, no murmurs, no rubs, no gallops  Respiratory: Clear to auscultation bilaterally, no wheezing, no crackles, no rhonchi  Abdomen: Soft, non tender, non distended, bowel sounds present, no guarding  Extremities: No edema, pulses DP and PT palpable bilaterally  Neuro: Grossly nonfocal  Data Reviewed: Basic Metabolic Panel:  Lab 03/11/12 1324 03/10/12 0705 03/09/12 0610 03/08/12 0550  NA 135 136 135 133*  K 3.9 4.0 3.9 3.5  CL 107 108 107 101  CO2 19 20 20  20   GLUCOSE 111* 98 101* 148*  BUN 27* 30* 36* 38*  CREATININE 0.85 0.93 1.12* 1.25*  CALCIUM 7.8* 7.7* 7.7* 7.5*  MG -- -- -- --  PHOS -- -- -- --   Liver Function Tests:  Lab 03/08/12 0550  AST 159*  ALT 55*  ALKPHOS 433*  BILITOT 12.3*  PROT 4.8*  ALBUMIN 1.4*   CBC:  Lab 03/14/12 0434 03/13/12 1725 03/12/12 0700 03/11/12 0555 03/10/12 0705  WBC 11.0* 10.7* 9.7 9.4 8.4  NEUTROABS -- -- -- -- --  HGB 7.1* 8.0* 8.0* 8.3* 8.6*  HCT 22.1* 24.8* 25.6* 26.1* 26.8*  MCV 89.8 88.9 90.5 89.4 89.0  PLT 27* 29* 34* 38* 50*   CBG:  Lab 03/14/12 1133 03/14/12 0733 03/13/12 2159 03/13/12 1737 03/13/12 1206  GLUCAP 163* 136* 161* 186* 184*    Recent Results (from the past 240 hour(s))  URINE CULTURE     Status: Normal   Collection Time   03/06/12  6:44 PM      Component Value Range Status Comment   Specimen Description URINE, CATHETERIZED   Final    Special Requests NONE   Final    Culture  Setup Time 03/07/2012 02:02   Final    Colony Count NO GROWTH   Final    Culture NO GROWTH   Final    Report Status 03/08/2012 FINAL   Final   CULTURE, BLOOD (ROUTINE X 2)     Status: Normal  Collection Time   03/06/12  8:00 PM      Component Value Range Status Comment   Specimen Description BLOOD LEFT WRIST   Final    Special Requests BOTTLES DRAWN AEROBIC AND ANAEROBIC 5CC   Final    Culture  Setup Time 03/07/2012 02:11   Final    Culture NO GROWTH 5 DAYS   Final    Report Status 03/13/2012 FINAL   Final   CULTURE, BLOOD (ROUTINE X 2)     Status: Normal   Collection Time   03/06/12  8:15 PM      Component Value Range Status Comment   Specimen Description BLOOD RIGHT ARM   Final    Special Requests BOTTLES DRAWN AEROBIC AND ANAEROBIC 3CC   Final    Culture  Setup Time 03/07/2012 02:11   Final    Culture NO GROWTH 5 DAYS   Final    Report Status 03/13/2012 FINAL   Final   CLOSTRIDIUM DIFFICILE BY PCR     Status: Normal   Collection Time   03/07/12  6:47 AM      Component Value Range  Status Comment   C difficile by pcr NEGATIVE  NEGATIVE Final      Studies: No results found.  Scheduled Meds:   . antiseptic oral rinse  15 mL Mouth Rinse q12n4p  . fentaNYL  112.5 mcg Transdermal Q72H  . insulin aspart  0-15 Units Subcutaneous TID WC  . insulin aspart  0-5 Units Subcutaneous QHS  . magic mouthwash w/lidocaine  10 mL Oral 5 X Daily  . pantoprazole  40 mg Oral Q1200  . sucralfate  1 g Oral TID WC & HS   Continuous Infusions:   . 0.9 % NaCl with KCl 20 mEq / L 20 mL/hr at 03/14/12 1191

## 2012-03-15 LAB — TYPE AND SCREEN
ABO/RH(D): A POS
Antibody Screen: NEGATIVE
Unit division: 0

## 2012-03-15 LAB — GLUCOSE, CAPILLARY
Glucose-Capillary: 183 mg/dL — ABNORMAL HIGH (ref 70–99)
Glucose-Capillary: 216 mg/dL — ABNORMAL HIGH (ref 70–99)
Glucose-Capillary: 202 mg/dL — ABNORMAL HIGH (ref 70–99)

## 2012-03-15 LAB — CBC
MCH: 29.8 pg (ref 26.0–34.0)
MCHC: 34.3 g/dL (ref 30.0–36.0)
MCV: 86.7 fL (ref 78.0–100.0)
Platelets: 35 10*3/uL — ABNORMAL LOW (ref 150–400)
RDW: 15.8 % — ABNORMAL HIGH (ref 11.5–15.5)
WBC: 13.5 10*3/uL — ABNORMAL HIGH (ref 4.0–10.5)

## 2012-03-15 LAB — COMPREHENSIVE METABOLIC PANEL
AST: 194 U/L — ABNORMAL HIGH (ref 0–37)
Albumin: 1.4 g/dL — ABNORMAL LOW (ref 3.5–5.2)
Calcium: 8.2 mg/dL — ABNORMAL LOW (ref 8.4–10.5)
Chloride: 105 mEq/L (ref 96–112)
Creatinine, Ser: 1.04 mg/dL (ref 0.50–1.10)
Total Protein: 4.6 g/dL — ABNORMAL LOW (ref 6.0–8.3)

## 2012-03-15 LAB — HAPTOGLOBIN: Haptoglobin: 231 mg/dL — ABNORMAL HIGH (ref 45–215)

## 2012-03-15 NOTE — Progress Notes (Signed)
CSW consulted the family about there placement options. CSW informed that an Letter of Guarantee was obtained. CSW was informed by the doctor that the patient would be discharging sometime after Monday. The patient will be starting Chemo therapy. CSW will reconsult on Monday and plan disposition. \  Vicki Sanders C. Leighton Ruff [phone]

## 2012-03-15 NOTE — Progress Notes (Signed)
[facility] Clinical Social Worker previously consulted in outpatient setting, but pt was admitted to [facility] which prevented [facility] CSW outpatient assessment.  Pt is currently being followed by inpatient CSW who continues to offer support with discharge plans and other psychosocial needs.  CSW was consulted by Medical oncologist to offer additional emotional support to pt and family.  CSW met with pt and family in the hospital room prior to Goals of Care meeting to offer support and information on continuum of care in the outpatient setting at [facility].  CSW attended GOC meeting on 03/14/12 with PMT provider, pt, pt's daughter, friend, and significant other.  Pt wishes to continue treatment, but has concerns for need of increased level of care.  Pt and family have discussed their discharge options with inpatient CSW, and feel pt would best be supported at a SNF level of care.  Inpatient CSW will continue to follow and support pt with discharge plans.  Pt and pt's oldest daughter had reviewed the Advance Directives booklet prior to CSW contact and identified [name] (pt oldest dtr) as HCPOA.  CSW notified inpatient CSW that AD had been reviewed, and pt was prepared to complete/have the documents notarized.  Pt also expressed concerns for her 45 year old daughter and determining long term custody arrangements.  CSW encouraged pt and family to contact Legal Aid for resources and guidance.  CSW will also provide information for the [facility].  CSW informed pt and family of the supportive services available at [facility].  [facility] information and resources for children and family members was provided.  CSW will continue to follow in the outpatient setting and support as needed.  CSW encouraged pt and family to call with any additional questions or concerns.        Tamala Julian, MSW, LCSW Clinical Social Worker [facility] [phone]

## 2012-03-15 NOTE — Progress Notes (Signed)
PRBC finished infusing at 0200. H&H drawn with CBC morning lab at 0400.

## 2012-03-15 NOTE — Progress Notes (Signed)
[facility] PROGRESS NOTE  Vicki Sanders NWG:[id number] DOB: [birth year] DOA: 03/06/2012 PCP: Sheila Oats, MD  Brief narrative:  Vicki Sanders is a 46 year old female with a past medical history of stage IV breast cancer, widely metastatic, who began chemotherapy last week with last dose given 03/05/2012 presented to the hospital on 03/06/2012 a chief complaint of fatigue, poor oral intake, and right upper quadrant abdominal pain. Upon initial evaluation emergency department, the patient was found to have transaminitis. An abdominal ultrasound was done which showed a normal bile duct.  At this time SW assisting with D/C planing; per oncology plan is to perhaps start chemo again while in hospital.  Assessment/Plan:   Principal Problem:  *Breast cancer metastasized to bone with fatigue, poor oral intake, and failure to thrive status post chemotherapy  Diagnosed 02/14/2012. First chemotherapy with Taxotere and carboplatinum given 02/29/2012 with subsequent dose given 03/05/2012. Also on Herceptin.  Next Herceptin dose due on 03/12/12. Will follow up with oncology in regards to further chemotherapy options  PCT to meet for goals of care  Dietitian, physical therapy, and occupational therapy evaluations performed. SNF recommended.  Active Problems:  Acute blood loss anemia  patient passed large blood clot per rectum today  Hemoglobin 7.1 yesterday and transfused 1 unit PRBC with Hgb today 10.3  Abdominal pain  Unclear etiology, lower abdomen. Urine culture negative for UTI.  CT abdomen and pelvis done 03/10/12, no acute findings.  Diarrhea resolved  Pain control improved with medication adjustment. Epistaxis secondary to thrombocytopenia  Off Lovenox as of 03/10/12.  SCDs for DVT prophylaxis.  Nasal saline PRN.  No further episodes. Mucositis (ulcerative) due to antineoplastic therapy  Continue Carafate and Magic Mouthwash Q 4 hours. Neutropenic fever  Status post Neulasta support as an  outpatient.  Unclear etiology of fever, but put on empiric Cipro for suspected urinary tract infection, which was discontinued on 03/08/12 after urine culture proved negative.  Blood and urine cultures sent, negative to date.  Stool for Clostridium difficile sent, negative.  Empiric antibiotics continued through 03/08/12, discontinued with negative cultures, no return of fever. Urinary tract infection  Urinalysis reviewed and was positive for nitrites, 3-6 white blood cells, and a few bacteria.  Urine cultures sent, negative.  Initially on empiric Cipro which was stopped 03/08/12. Pancytopenia due to chemotherapy  White blood cell count normalized.  Platelets and hemoglobin relatively stable Transfused 1 unit PRBC 03/14/2012 with appropriate rise in hemoglobin Transaminitis likely due to liver metastasis  Liver ultrasound consistent with metastatic disease.  No evidence of common bile duct obstruction. No indication for stenting at this time.  LFTs slowly trending down. Hyponatremia  May be related to dehydration versus SIADH in the setting of known malignancy.  stable DM (diabetes mellitus), type 2, uncontrolled with complications  Hemoglobin A1c indicative of poorly controlled diabetes.  The patient states she's been on diabetes medicines in the past, but stopped these due to inability to afford them.  Placed on moderate scale sliding scale insulin 03/07/12. CBGs 90-150 over past 24 hours.  Seen by Diabetes coordinator on 03/07/12.  Continue carbohydrate modified diet.  Patient reports that she has taken insulin in the past, and knows how to give herself insulin shots, so will likely d/c her on insulin. Acute kidney injury  Baseline creatinine 0.63 on 02/20/2012. Current creatinine elevated over her usual baseline values, and likely due to a prerenal etiology.  Continue to hydrate and monitor creatinine  Creatinine is within normal limits now Severe Protein calorie malnutrition /  Obesity BMI  35.2  Poor protein stores in the setting of decreased oral intake secondary to cancer.  Dietitian consultation performed 03/07/12.  Code Status: Full  Family Communication: Boyfriend, Kandis Mannan, updated at bedside 03/11/12.  Disposition Plan: SNF placement   Medical Consultants:  Dr. Drue Second, Oncology  Palliative Care Team Other consultants:  Diabetes coordinator  Dietitian  Physical therapy:SNF recommended. Antibiotics:  Cipro 03/06/2012--->03/08/12  Flagyl 03/07/12--->03/08/12  Manson Passey, MD  [facility] Pager [phone]  If 7PM-7AM, please contact night-coverage [email or web address] Password [email or web address] 03/15/2012, 2:09 PM   LOS: 9 days   HPI/Subjective: No acute events overnight  Objective: Filed Vitals:   03/15/12 0007 03/15/12 0107 03/15/12 0203 03/15/12 0640  BP: 106/44 108/46 100/48 100/46  Pulse: 100 89 88 98  Temp: 98.5 F (36.9 C) 97.9 F (36.6 C) 98.6 F (37 C) 98.7 F (37.1 C)  TempSrc: Oral Oral Oral Oral  Resp: 16 16 18 16   Height:      Weight:      SpO2:    97%    Intake/Output Summary (Last 24 hours) at 03/15/12 1409 Last data filed at 03/15/12 0950  Gross per 24 hour  Intake 1425.33 ml  Output      0 ml  Net 1425.33 ml    Exam:   General:  Pt is alert, follows commands appropriately, not in acute distress;l jaundiced skin  Cardiovascular: Regular rate and rhythm, S1/S2, no murmurs, no rubs, no gallops  Respiratory: Clear to auscultation bilaterally, no wheezing, no crackles, no rhonchi  Abdomen: Soft, non tender, non distended, bowel sounds present, no guarding  Extremities: No edema, pulses DP and PT palpable bilaterally  Neuro: Grossly nonfocal  Data Reviewed: Basic Metabolic Panel:  Lab 03/15/12 4540 03/14/12 1551 03/11/12 0555 03/10/12 0705 03/09/12 0610  NA 134* 133* 135 136 135  K 4.0 3.8 3.9 4.0 3.9  CL 105 105 107 108 107  CO2 19 19 19 20 20   GLUCOSE 176* 166* 111* 98 101*  BUN 33* 33* 27* 30* 36*  CREATININE 1.04 1.06  0.85 0.93 1.12*  CALCIUM 8.2* 8.2* 7.8* 7.7* 7.7*   Liver Function Tests:  Lab 03/15/12 0345 03/14/12 1551  AST 194* 202*  ALT 63* 65*  ALKPHOS 395* 422*  BILITOT 10.3* 10.1*  PROT 4.6* 4.7*  ALBUMIN 1.4* 1.4*   CBC:  Lab 03/15/12 0345 03/14/12 0434 03/13/12 1725 03/12/12 0700 03/11/12 0555  WBC 13.5* 11.0* 10.7* 9.7 9.4  HGB 10.3* 7.1* 8.0* 8.0* 8.3*  HCT 30.0* 22.1* 24.8* 25.6* 26.1*  MCV 86.7 89.8 88.9 90.5 89.4  PLT 35* 27* 29* 34* 38*   CBG:  Lab 03/15/12 1204 03/15/12 0816 03/14/12 2220 03/14/12 1729 03/14/12 1133  GLUCAP 216* 183* 123* 148* 163*    URINE CULTURE     Status: Normal   Collection Time   03/06/12  6:44 PM      Component Value Range Status Comment   Specimen Description URINE, CATH  Final    Colony Count NO GROWTH   Final    Culture NO GROWTH   Final    Report Status 03/08/2012 FINAL   Final   CULTURE, BLOOD (ROUTINE X 2)     Status: Normal   Collection Time   03/06/12  8:00 PM      Component Value Range Status Comment   Culture NO GROWTH 5 DAYS   Final    Report Status 03/13/2012 FINAL   Final   CULTURE, BLOOD (ROUTINE X 2)  Status: Normal   Collection Time   03/06/12  8:15 PM      Component Value Range Status Comment   Culture NO GROWTH 5 DAYS   Final    Report Status 03/13/2012 FINAL   Final   CLOSTRIDIUM DIFFICILE BY PCR     Status: Normal   Collection Time   03/07/12  6:47 AM      Component Value Range Status Comment   C difficile by pcr NEGATIVE  NEGATIVE Final      Studies: No results found.  Scheduled Meds:   . acetaminophen  650 mg Oral Once  . antiseptic oral rinse  15 mL Mouth Rinse q12n4p  . diphenhydrAMINE  25 mg Oral Once  . fentaNYL  112.5 mcg Transdermal Q72H  . insulin aspart  0-15 Units Subcutaneous TID WC  . insulin aspart  0-5 Units Subcutaneous QHS  . magic mouthwash w/lidocaine  10 mL Oral 5 X Daily  . ondansetron  4 mg Intravenous TID   Or  . ondansetron  4 mg Oral TID  . pantoprazole  40 mg Oral Q1200  .  sucralfate  1 g Oral TID WC & HS   Continuous Infusions:   . 0.9 % NaCl with KCl 20 mEq / L 20 mL/hr at 03/14/12 5621

## 2012-03-15 NOTE — Progress Notes (Signed)
Patient was not physically examined today, was on phone talking with daughter [name]. She informed me her nausea and pain were under control, and that [name] would be able to meet Tuesday at 3pm. Will have Lorinda Creed NP from palliative care follow up with patient over the weekend to see if goals can be addressed with patient. Per Social Work note patient maybe discharged prior to scheduled meeting with daughter and patient with PMT on 03/19/12.   Freddie Breech, CNS-C Palliative Medicine Team [facility] Team Phone: [phone] Pager: [phone]

## 2012-03-15 NOTE — Consult Note (Signed)
Reviewed case with NP

## 2012-03-16 DIAGNOSIS — R5383 Other fatigue: Secondary | ICD-10-CM

## 2012-03-16 DIAGNOSIS — R630 Anorexia: Secondary | ICD-10-CM

## 2012-03-16 DIAGNOSIS — D649 Anemia, unspecified: Secondary | ICD-10-CM

## 2012-03-16 LAB — BASIC METABOLIC PANEL
CO2: 19 mEq/L (ref 19–32)
Calcium: 8.3 mg/dL — ABNORMAL LOW (ref 8.4–10.5)
Chloride: 107 mEq/L (ref 96–112)
Creatinine, Ser: 1.09 mg/dL (ref 0.50–1.10)
Glucose, Bld: 186 mg/dL — ABNORMAL HIGH (ref 70–99)

## 2012-03-16 LAB — CBC
Hemoglobin: 9.4 g/dL — ABNORMAL LOW (ref 12.0–15.0)
MCH: 29.4 pg (ref 26.0–34.0)
MCV: 88.4 fL (ref 78.0–100.0)
RBC: 3.2 MIL/uL — ABNORMAL LOW (ref 3.87–5.11)
WBC: 11.7 10*3/uL — ABNORMAL HIGH (ref 4.0–10.5)

## 2012-03-16 LAB — GLUCOSE, CAPILLARY
Glucose-Capillary: 160 mg/dL — ABNORMAL HIGH (ref 70–99)
Glucose-Capillary: 130 mg/dL — ABNORMAL HIGH (ref 70–99)
Glucose-Capillary: 158 mg/dL — ABNORMAL HIGH (ref 70–99)

## 2012-03-16 MED ORDER — DOCUSATE SODIUM 100 MG PO CAPS
100.0000 mg | ORAL_CAPSULE | Freq: Two times a day (BID) | ORAL | Status: DC | PRN
  Filled 2012-03-16: qty 1

## 2012-03-16 MED ORDER — POLYETHYLENE GLYCOL 3350 17 G PO PACK
17.0000 g | PACK | Freq: Every day | ORAL | Status: DC
  Administered 2012-03-19 – 2012-03-20 (×2): 17 g via ORAL
  Filled 2012-03-16 (×5): qty 1

## 2012-03-16 NOTE — Progress Notes (Signed)
Pt voiding burgandy color urine. Continues with 2 small clots appears to be from vagina.Continue to assess and monitor.

## 2012-03-16 NOTE — Progress Notes (Addendum)
[facility] PROGRESS NOTE  Vicki Sanders ZOX:[id number] DOB: 1965/12/17 DOA: 03/06/2012 PCP: Vicki Oats, MD  Brief narrative:  Vicki Sanders is a 46 year old female with a past medical history of stage IV breast cancer, widely metastatic, who began chemotherapy last week with last dose given 03/05/2012 presented to the hospital on 03/06/2012 a chief complaint of fatigue, poor oral intake, and right upper quadrant abdominal pain. Upon initial evaluation emergency department, the patient was found to have transaminitis. An abdominal ultrasound was done which showed a normal bile duct.  At this time SW assisting with D/C planing; per oncology plan is to perhaps start chemo again while in hospital.   Assessment/Plan:   Principal Problem:  *Breast cancer metastasized to bone with fatigue, poor oral intake, and failure to thrive status post chemotherapy  Diagnosed 02/14/2012. First chemotherapy with Taxotere and carboplatinum given 02/29/2012 with subsequent dose given 03/05/2012. Also on Herceptin.  Next Herceptin dose due on 03/12/12. Will follow up with oncology in regards to further chemotherapy options  PCT for goals of care  Dietitian, physical therapy, and occupational therapy evaluations performed. SNF recommended.  Active Problems:  Acute blood loss anemia  patient passed large blood clot per rectum yesterday Hemoglobin 7.1 yesterday and transfused 1 unit PRBC with Hgb today 10.3  Abdominal pain  Unclear etiology, lower abdomen. Urine culture negative for UTI.  CT abdomen and pelvis done 03/10/12, no acute findings.  Diarrhea resolved  Pain control improved with medication adjustment. Epistaxis secondary to thrombocytopenia  Off Lovenox as of 03/10/12.  SCDs for DVT prophylaxis.  Nasal saline PRN.  No further episodes. Mucositis (ulcerative) due to antineoplastic therapy  Continue Carafate and Magic Mouthwash Q 4 hours. Neutropenic fever  Status post Neulasta support as an  outpatient.  Unclear etiology of fever, but put on empiric Cipro for suspected urinary tract infection, which was discontinued on 03/08/12 after urine culture proved negative.  Blood and urine cultures sent, negative to date.  Stool for Clostridium difficile sent, negative.  Empiric antibiotics continued through 03/08/12, discontinued with negative cultures, no return of fever. Urinary tract infection  Urinalysis reviewed and was positive for nitrites, 3-6 white blood cells, and a few bacteria.  Urine cultures sent, negative.  Initially on empiric Cipro which was stopped 03/08/12. Pancytopenia due to chemotherapy  White blood cell count normalized.  Platelets and hemoglobin relatively stable; platelet count slowly trending up Transfused 1 unit PRBC 03/14/2012 with appropriate rise in hemoglobin Transaminitis likely due to liver metastasis  Liver ultrasound consistent with metastatic disease.  No evidence of common bile duct obstruction. No indication for stenting at this time.  LFTs slowly trending down. Hyponatremia  May be related to dehydration versus SIADH in the setting of known malignancy.  stable DM (diabetes mellitus), type 2, uncontrolled with complications secondary to noncompliance Hemoglobin A1c indicative of poorly controlled diabetes.  moderate scale sliding scale insulin  Seen by Diabetes coordinator on 03/07/12.  Continue carbohydrate modified diet.  Patient reported that she has taken insulin in the past, and knows how to give herself insulin shots, so will likely d/c her on insulin. Acute kidney injury  Baseline creatinine 0.63 on 02/20/2012. Current creatinine elevated over her usual baseline values, and likely due to a prerenal etiology.  Continue to hydrate and monitor creatinine  Creatinine is within normal limits now Severe Protein calorie malnutrition / Obesity BMI 35.2  Poor protein stores in the setting of decreased oral intake secondary to cancer.  Dietitian  consultation performed 03/07/12.  Code  Status: Full  Family Communication: Boyfriend, Vicki Sanders, updated at bedside 03/11/12.  Disposition Plan: SNF placement - anticipate D?Vicki Sanders or tuesday  Medical Consultants:  Dr. Drue Second, Oncology  Palliative Care Team Other consultants:  Diabetes coordinator  Dietitian  Physical therapy:SNF recommended. Antibiotics:  Cipro 03/06/2012--->03/08/12  Flagyl 03/07/12--->03/08/12  Manson Passey, MD  [facility] Pager [phone]  If 7PM-7AM, please contact night-coverage [email or web address] Password [facility] 03/16/2012, 10:33 AM   LOS: 10 days   HPI/Subjective: No acute events overnight.  Objective: Filed Vitals:   03/15/12 0203 03/15/12 0640 03/15/12 2200 03/16/12 0556  BP: 100/48 100/46 118/62 110/70  Pulse: 88 98 74 92  Temp: 98.6 F (37 C) 98.7 F (37.1 C) 97.9 F (36.6 C) 97.5 F (36.4 C)  TempSrc: Oral Oral Oral Oral  Resp: 18 16 16 17   Height:      Weight:      SpO2:  97% 96% 98%    Intake/Output Summary (Last 24 hours) at 03/16/12 1033 Last data filed at 03/16/12 0600  Gross per 24 hour  Intake    600 ml  Output      0 ml  Net    600 ml    Exam:   General:  Pt is alert, follows commands appropriately, not in acute distress; jaundiced   Cardiovascular: Regular rate and rhythm, S1/S2, no murmurs, no rubs, no gallops  Respiratory: Clear to auscultation bilaterally, no wheezing, no crackles, no rhonchi  Abdomen: Soft, non tender, non distended, bowel sounds present, no guarding  Extremities: No edema, pulses DP and PT palpable bilaterally  Neuro: Grossly nonfocal  Data Reviewed: Basic Metabolic Panel:  Lab 03/16/12 4540 03/15/12 0345 03/14/12 1551 03/11/12 0555 03/10/12 0705  NA 136 134* 133* 135 136  K 3.8 4.0 3.8 3.9 4.0  CL 107 105 105 107 108  CO2 19 19 19 19 20   GLUCOSE 186* 176* 166* 111* 98  BUN 33* 33* 33* 27* 30*  CREATININE 1.09 1.04 1.06 0.85 0.93  CALCIUM 8.3* 8.2* 8.2* 7.8* 7.7*   Liver  Function Tests:  Lab 03/15/12 0345 03/14/12 1551  AST 194* 202*  ALT 63* 65*  ALKPHOS 395* 422*  BILITOT 10.3* 10.1*  PROT 4.6* 4.7*  ALBUMIN 1.4* 1.4*   CBC:  Lab 03/16/12 0500 03/15/12 0345 03/14/12 0434 03/13/12 1725 03/12/12 0700  WBC 11.7* 13.5* 11.0* 10.7* 9.7  HGB 9.4* 10.3* 7.1* 8.0* 8.0*  HCT 28.3* 30.0* 22.1* 24.8* 25.6*  MCV 88.4 86.7 89.8 88.9 90.5  PLT 52* 35* 27* 29* 34*   CBG:  Lab 03/16/12 0740 03/15/12 2153 03/15/12 1723 03/15/12 1204 03/15/12 0816  GLUCAP 160* 202* 205* 216* 183*    URINE CULTURE     Status: Normal   Collection Time   03/06/12  6:44 PM      Component Value Range Status Comment   Specimen Description URINE, CATH  Final    Culture NO GROWTH   Final    Report Status 03/08/2012 FINAL   Final   CULTURE, BLOOD (ROUTINE X 2)     Status: Normal   Collection Time   03/06/12  8:00 PM      Component Value Range Status Comment   Culture NO GROWTH 5 DAYS   Final    Report Status 03/13/2012 FINAL   Final   CULTURE, BLOOD (ROUTINE X 2)     Status: Normal   Collection Time   03/06/12  8:15 PM      Component Value Range  Status Comment   Culture NO GROWTH 5 DAYS   Final    Report Status 03/13/2012 FINAL   Final   CLOSTRIDIUM DIFFICILE BY PCR     Status: Normal   Collection Time   03/07/12  6:47 AM      Component Value Range Status Comment   C difficile by pcr NEGATIVE  NEGATIVE Final      Studies: No results found.  Scheduled Meds:   . antiseptic oral rinse  15 mL Mouth Rinse q12n4p  . fentaNYL  112.5 mcg Transdermal Q72H  . insulin aspart  0-15 Units Subcutaneous TID WC  . insulin aspart  0-5 Units Subcutaneous QHS  . magic mouthwash w/lidocaine  10 mL Oral 5 X Daily  . ondansetron  4 mg Intravenous TID   Or  . ondansetron  4 mg Oral TID  . pantoprazole  40 mg Oral Q1200  . polyethylene glycol  17 g Oral Daily  . sucralfate  1 g Oral TID WC & HS   Continuous Infusions:   . 0.9 % NaCl with KCl 20 mEq / L 20 mL/hr at 03/14/12 9811

## 2012-03-16 NOTE — Progress Notes (Signed)
Progress Note from the Palliative Medicine Team at [facility]  Subjective: patient wake and oriented, friend/SO Kandis Mannan at bedside, continued conversation regarding GOC and options  States " today is a good day"     Objective: Allergies  Allergen Reactions  . Cephalosporins Hives and Itching   Scheduled Meds:   . antiseptic oral rinse  15 mL Mouth Rinse q12n4p  . fentaNYL  112.5 mcg Transdermal Q72H  . insulin aspart  0-15 Units Subcutaneous TID WC  . insulin aspart  0-5 Units Subcutaneous QHS  . magic mouthwash w/lidocaine  10 mL Oral 5 X Daily  . ondansetron  4 mg Intravenous TID   Or  . ondansetron  4 mg Oral TID  . pantoprazole  40 mg Oral Q1200  . polyethylene glycol  17 g Oral Daily  . sucralfate  1 g Oral TID WC & HS   Continuous Infusions:   . 0.9 % NaCl with KCl 20 mEq / L 20 mL/hr at 03/16/12 1100   PRN Meds:.alum & mag hydroxide-simeth, diazepam, docusate sodium, lidocaine-prilocaine, morphine injection, oxyCODONE, prochlorperazine, sodium chloride, zolpidem  BP 110/70  Pulse 92  Temp 97.5 F (36.4 C) (Oral)  Resp 17  Ht 5\' 4"  (1.626 m)  Wt 93.7 kg (206 lb 9.1 oz)  BMI 35.46 kg/m2  SpO2 98%  LMP 03/02/2012   PPS:40%  Pain Score: "not bad" Pain Locationabdomen   Intake/Output Summary (Last 24 hours) at 03/16/12 1352 Last data filed at 03/16/12 0900  Gross per 24 hour  Intake    840 ml  Output      0 ml  Net    840 ml       Physical Exam:  General: chronically ill appeaitn NAD HEENT:  scerla jaudiced Chest:   CTA CVS: RRR Abdomen: soft +BS, tender on light palpation Ext: without edema Neuro:oriented x3  Labs: CBC    Component Value Date/Time   WBC 11.7* 03/16/2012 0500   WBC 1.2* 03/05/2012 1533   RBC 3.20* 03/16/2012 0500   RBC 3.65* 03/05/2012 1533   HGB 9.4* 03/16/2012 0500   HGB 10.5* 03/05/2012 1533   HCT 28.3* 03/16/2012 0500   HCT 31.8* 03/05/2012 1533   PLT 52* 03/16/2012 0500   PLT 103* 03/05/2012 1533   MCV 88.4 03/16/2012 0500   MCV  87.1 03/05/2012 1533   MCH 29.4 03/16/2012 0500   MCH 28.8 03/05/2012 1533   MCHC 33.2 03/16/2012 0500   MCHC 33.0 03/05/2012 1533   RDW 16.6* 03/16/2012 0500   RDW 15.4* 03/05/2012 1533   LYMPHSABS 0.5* 03/06/2012 1535   LYMPHSABS 0.6* 03/05/2012 1533   MONOABS 0.6 03/06/2012 1535   MONOABS 0.2 03/05/2012 1533   EOSABS 0.0 03/06/2012 1535   EOSABS 0.0 03/05/2012 1533   BASOSABS 0.0 03/06/2012 1535   BASOSABS 0.0 03/05/2012 1533    BMET    Component Value Date/Time   NA 136 03/16/2012 0500   K 3.8 03/16/2012 0500   CL 107 03/16/2012 0500   CO2 19 03/16/2012 0500   GLUCOSE 186* 03/16/2012 0500   BUN 33* 03/16/2012 0500   CREATININE 1.09 03/16/2012 0500   CALCIUM 8.3* 03/16/2012 0500   GFRNONAA 60* 03/16/2012 0500   GFRAA 70* 03/16/2012 0500    CMP     Component Value Date/Time   NA 136 03/16/2012 0500   K 3.8 03/16/2012 0500   CL 107 03/16/2012 0500   CO2 19 03/16/2012 0500   GLUCOSE 186* 03/16/2012 0500   BUN  33* 03/16/2012 0500   CREATININE 1.09 03/16/2012 0500   CALCIUM 8.3* 03/16/2012 0500   PROT 4.6* 03/15/2012 0345   ALBUMIN 1.4* 03/15/2012 0345   AST 194* 03/15/2012 0345   ALT 63* 03/15/2012 0345   ALKPHOS 395* 03/15/2012 0345   BILITOT 10.3* 03/15/2012 0345   GFRNONAA 60* 03/16/2012 0500   GFRAA 70* 03/16/2012 0500      Assessment and Plan: 1. Code Status:DNR/DNI 2. Symptom Control: Pain:continue present medications, pt is comfortable with this plan of care            Anorexia:  Regular diet with know DM-continue to cover elevated glucose levels (this is for comfort and enjoyment at this point in her disease process)  3. Psycho/Social:emotional support offered, this is difficult for this patient and family, struggling with overall poor prognosis and hope for further tx 4. Spiritual: consult to chaplain written for 5. Disposition: pt is unsure what the dc plan is.  She wishes to seek the treatment that Dr Welton Flakes has discussed with her.  However this is confusing to her as to how this will work depending  on her placement.  I strongly encouraged the patient to continue discussion regarding her plan of care as it relates to her values and goals of care.  Her overall prognosis is poor and she is aware of this.     Time In Time Out Total Time Spent with Patient Total Overall Time  1335 1410 35 min 35 min    Greater than 50%  of this time was spent counseling and coordinating care related to the above assessment and plan.  Gardiner Rhyme NP  [phone]   1

## 2012-03-17 LAB — BASIC METABOLIC PANEL
Chloride: 107 mEq/L (ref 96–112)
Creatinine, Ser: 1.18 mg/dL — ABNORMAL HIGH (ref 0.50–1.10)
GFR calc Af Amer: 64 mL/min — ABNORMAL LOW (ref >90)
Potassium: 3.9 mEq/L (ref 3.5–5.1)
Sodium: 135 mEq/L (ref 135–145)

## 2012-03-17 LAB — CBC
Platelets: 65 10*3/uL — ABNORMAL LOW (ref 150–400)
RDW: 17.2 % — ABNORMAL HIGH (ref 11.5–15.5)
WBC: 10.7 10*3/uL — ABNORMAL HIGH (ref 4.0–10.5)

## 2012-03-17 LAB — GLUCOSE, CAPILLARY
Glucose-Capillary: 159 mg/dL — ABNORMAL HIGH (ref 70–99)
Glucose-Capillary: 152 mg/dL — ABNORMAL HIGH (ref 70–99)
Glucose-Capillary: 167 mg/dL — ABNORMAL HIGH (ref 70–99)
Glucose-Capillary: 184 mg/dL — ABNORMAL HIGH (ref 70–99)

## 2012-03-17 MED ORDER — VITAMINS A & D EX OINT
TOPICAL_OINTMENT | CUTANEOUS | Status: AC
  Administered 2012-03-17: 08:00:00
  Filled 2012-03-17: qty 5

## 2012-03-17 MED ORDER — SODIUM CHLORIDE 0.9 % IV BOLUS (SEPSIS)
500.0000 mL | Freq: Once | INTRAVENOUS | Status: AC
  Administered 2012-03-17: 500 mL via INTRAVENOUS

## 2012-03-17 NOTE — Progress Notes (Signed)
Pt. BP=90/48, HR=76, Resp.=18, O2 Sat= 97% room air. Temp. 98.6. Pt. Denies complaints of chest pain, dizziness and no shortness of breath noted. Pt. States she just feels "weak". Dr. Elisabeth Pigeon notified, new orders received. NS Bolus started, continue to assess and monitor.

## 2012-03-17 NOTE — Progress Notes (Signed)
CSW was unable to get in contact with dtr this weekend.   CSW sent an email back to the dtr in reference to facility choice.   CSW does not have a good contact number for dtr.   Weekday CSW to f/u.   Leron Croak, LCSWA [employer] [phone]

## 2012-03-17 NOTE — Progress Notes (Addendum)
[facility] PROGRESS NOTE  Verneice Caspers ZOX:[id number] DOB: 05-Mar-1966 DOA: 03/06/2012 PCP: Sheila Oats, MD  Brief narrative:  Mrs. Hardigree is a 46 year old female with a past medical history of stage IV breast cancer, widely metastatic, who began chemotherapy last week with last dose given 03/05/2012 presented to the hospital on 03/06/2012 a chief complaint of fatigue, poor oral intake, and right upper quadrant abdominal pain. Upon initial evaluation emergency department, the patient was found to have transaminitis. An abdominal ultrasound was done which showed a normal bile duct.  At this time SW assisting with D/C planing; per oncology plan is to perhaps start chemo again while in hospital.   Assessment/Plan:   Principal Problem:  *Breast cancer metastasized to bone with fatigue, poor oral intake, and failure to thrive status post chemotherapy  Diagnosed 02/14/2012. First chemotherapy with Taxotere and carboplatinum given 02/29/2012 with subsequent dose given 03/05/2012. Also on Herceptin.  Next Herceptin dose due on 03/12/12. Will follow up with oncology in regards to further chemotherapy options  Appreciate palliative care team following  Dietitian, physical therapy, and occupational therapy evaluations performed. SNF recommended.  Active Problems:  Acute blood loss anemia  patient passed large blood clot per rectum   transfused 1 unit PRBC with Hgb stable at this time ~ 9 Abdominal pain  Unclear etiology, lower abdomen. Urine culture negative for UTI.  CT abdomen and pelvis done 03/10/12, no acute findings.  Diarrhea resolved  Pain control improved with medication adjustment. Epistaxis secondary to thrombocytopenia  Off Lovenox as of 03/10/12.  SCDs for DVT prophylaxis.  Nasal saline PRN.  No further episodes. Mucositis (ulcerative) due to antineoplastic therapy  Continue Carafate and Magic Mouthwash Q 4 hours. Neutropenic fever  Status post Neulasta support as an outpatient.    Unclear etiology of fever, but put on empiric Cipro for suspected urinary tract infection, which was discontinued on 03/08/12 after urine culture proved negative.  Blood and urine cultures sent, negative to date.  Stool for Clostridium difficile sent, negative.  Empiric antibiotics continued through 03/08/12, discontinued with negative cultures, no return of fever. Urinary tract infection  Urinalysis reviewed and was positive for nitrites, 3-6 white blood cells, and a few bacteria.  Urine cultures sent, negative.  Initially on empiric Cipro which was stopped 03/08/12. Pancytopenia due to chemotherapy  White blood cell count slowly trending down Platelets and hemoglobin relatively stable; platelet count slowly trending up  Transfused 1 unit PRBC 03/14/2012 with appropriate rise in hemoglobin Transaminitis likely due to liver metastasis  Liver ultrasound consistent with metastatic disease.  No evidence of common bile duct obstruction. No indication for stenting at this time.  LFTs slowly trending down. Hyponatremia  May be related to dehydration versus SIADH in the setting of known malignancy.  stable DM (diabetes mellitus), type 2, uncontrolled with complications secondary to noncompliance  Hemoglobin A1c indicative of poorly controlled diabetes.  moderate scale sliding scale insulin  Seen by Diabetes coordinator on 03/07/12.  Continue carbohydrate modified diet.  Patient reported that she has taken insulin in the past, and knows how to give herself insulin shots, so will likely d/c her on insulin. Acute kidney injury  Baseline creatinine 0.63 on 02/20/2012. Current creatinine elevated over her usual baseline values, and likely due to a prerenal etiology.  Continue to hydrate and monitor creatinine  Severe Protein calorie malnutrition / Obesity BMI 35.2  Poor protein stores in the setting of decreased oral intake secondary to cancer.  Dietitian consultation performed 03/07/12.  Code Status: Full  Family Communication: Boyfriend, Kandis Mannan, updated at bedside 03/11/12; daughters updated at bedside 03/16/2012 Disposition Plan: SNF placement - anticipate D/C monday or tuesday   Medical Consultants:  Dr. Drue Second, Oncology  Palliative Care Team Other consultants:  Diabetes coordinator  Dietitian  Physical therapy:SNF recommended. Antibiotics:  Cipro 03/06/2012--->03/08/12  Flagyl 03/07/12--->03/08/12  Manson Passey, MD  [facility] Pager [phone]  If 7PM-7AM, please contact night-coverage [email or web address] Password [facility] 03/17/2012, 11:31 AM   LOS: 11 days   HPI/Subjective: No acute distress  Objective: Filed Vitals:   03/16/12 0556 03/16/12 1410 03/16/12 2129 03/17/12 0527  BP: 110/70 92/42 90/46  98/48  Pulse: 92 76 93 92  Temp: 97.5 F (36.4 C) 98.6 F (37 C) 98.6 F (37 C) 98.5 F (36.9 C)  TempSrc: Oral Oral Oral Oral  Resp: 17 18 17 16   Height:      Weight:      SpO2: 98% 97% 99% 97%    Intake/Output Summary (Last 24 hours) at 03/17/12 1131 Last data filed at 03/17/12 0500  Gross per 24 hour  Intake    460 ml  Output    900 ml  Net   -440 ml    Exam:   General:  Pt is alert, follows commands appropriately, not in acute distress; jaundiced  Cardiovascular: Regular rate and rhythm, S1/S2, no murmurs, no rubs, no gallops  Respiratory: Clear to auscultation bilaterally, no wheezing, no crackles, no rhonchi  Abdomen: Soft, non tender, non distended, bowel sounds present, no guarding  Extremities: No edema, pulses DP and PT palpable bilaterally  Neuro: Grossly nonfocal  Data Reviewed: Basic Metabolic Panel:  Lab 03/17/12 4540 03/16/12 0500 03/15/12 0345 03/14/12 1551 03/11/12 0555  NA 135 136 134* 133* 135  K 3.9 3.8 4.0 3.8 3.9  CL 107 107 105 105 107  CO2 19 19 19 19 19   GLUCOSE 176* 186* 176* 166* 111*  BUN 35* 33* 33* 33* 27*  CREATININE 1.18* 1.09 1.04 1.06 0.85  CALCIUM 8.4 8.3* 8.2* 8.2* 7.8*  MG -- -- -- -- --  PHOS -- -- --  -- --   Liver Function Tests:  Lab 03/15/12 0345 03/14/12 1551  AST 194* 202*  ALT 63* 65*  ALKPHOS 395* 422*  BILITOT 10.3* 10.1*  PROT 4.6* 4.7*  ALBUMIN 1.4* 1.4*     Lab 03/17/12 0805 03/16/12 0500 03/15/12 0345 03/14/12 0434 03/13/12 1725  WBC 10.7* 11.7* 13.5* 11.0* 10.7*  NEUTROABS -- -- -- -- --  HGB 9.2* 9.4* 10.3* 7.1* 8.0*  HCT 27.3* 28.3* 30.0* 22.1* 24.8*  MCV 88.3 88.4 86.7 89.8 88.9  PLT 65* 52* 35* 27* 29*   CBG:  Lab 03/17/12 0729 03/16/12 1648 03/16/12 1149 03/16/12 0740 03/15/12 2153  GLUCAP 152* 158* 130* 160* 202*    No results found for this or any previous visit (from the past 240 hour(s)).   Studies: No results found.  Scheduled Meds:   . antiseptic oral rinse  15 mL Mouth Rinse q12n4p  . fentaNYL  112.5 mcg Transdermal Q72H  . insulin aspart  0-15 Units Subcutaneous TID WC  . insulin aspart  0-5 Units Subcutaneous QHS  . magic mouthwash w/lidocaine  10 mL Oral 5 X Daily  . ondansetron  4 mg Intravenous TID   Or  . ondansetron  4 mg Oral TID  . pantoprazole  40 mg Oral Q1200  . polyethylene glycol  17 g Oral Daily  . sucralfate  1 g Oral TID WC & HS  .  vitamin A & D       Continuous Infusions:   . 0.9 % NaCl with KCl 20 mEq / L 20 mL/hr at 03/16/12 1513

## 2012-03-18 DIAGNOSIS — R1011 Right upper quadrant pain: Secondary | ICD-10-CM

## 2012-03-18 DIAGNOSIS — R630 Anorexia: Secondary | ICD-10-CM

## 2012-03-18 LAB — CBC
MCH: 29.7 pg (ref 26.0–34.0)
Platelets: 92 10*3/uL — ABNORMAL LOW (ref 150–400)
RBC: 3 MIL/uL — ABNORMAL LOW (ref 3.87–5.11)
WBC: 10.6 10*3/uL — ABNORMAL HIGH (ref 4.0–10.5)

## 2012-03-18 LAB — BASIC METABOLIC PANEL
CO2: 18 mEq/L — ABNORMAL LOW (ref 19–32)
Calcium: 8.6 mg/dL (ref 8.4–10.5)
Potassium: 4 mEq/L (ref 3.5–5.1)
Sodium: 136 mEq/L (ref 135–145)

## 2012-03-18 LAB — GLUCOSE, CAPILLARY: Glucose-Capillary: 259 mg/dL — ABNORMAL HIGH (ref 70–99)

## 2012-03-18 MED ORDER — SODIUM CHLORIDE 0.9 % IV SOLN: Freq: Once | INTRAVENOUS | Status: DC

## 2012-03-18 MED ORDER — HEPARIN SOD (PORK) LOCK FLUSH 100 UNIT/ML IV SOLN: 500.0000 [IU] | Freq: Once | INTRAVENOUS | Status: AC | PRN

## 2012-03-18 MED ORDER — DIPHENHYDRAMINE HCL 50 MG/ML IJ SOLN: 25.0000 mg | Freq: Once | INTRAMUSCULAR | Status: AC | PRN

## 2012-03-18 MED ORDER — LORAZEPAM 2 MG/ML IJ SOLN: 1.0000 mg | Freq: Four times a day (QID) | INTRAMUSCULAR | Status: DC | PRN

## 2012-03-18 MED ORDER — ALTEPLASE 2 MG IJ SOLR
2.0000 mg | Freq: Once | INTRAMUSCULAR | Status: AC | PRN
  Filled 2012-03-18: qty 2

## 2012-03-18 MED ORDER — EPINEPHRINE HCL 0.1 MG/ML IJ SOLN
0.2500 mg | Freq: Once | INTRAMUSCULAR | Status: AC | PRN
  Filled 2012-03-18: qty 10

## 2012-03-18 MED ORDER — EPINEPHRINE HCL 1 MG/ML IJ SOLN
0.5000 mg | Freq: Once | INTRAMUSCULAR | Status: AC | PRN
  Filled 2012-03-18: qty 1

## 2012-03-18 MED ORDER — PROCHLORPERAZINE EDISYLATE 5 MG/ML IJ SOLN
10.0000 mg | Freq: Four times a day (QID) | INTRAMUSCULAR | Status: DC | PRN
  Administered 2012-03-18: 10 mg via INTRAVENOUS

## 2012-03-18 MED ORDER — DIPHENHYDRAMINE HCL 50 MG PO CAPS
50.0000 mg | ORAL_CAPSULE | Freq: Once | ORAL | Status: AC
  Administered 2012-03-18: 50 mg via ORAL
  Filled 2012-03-18: qty 1

## 2012-03-18 MED ORDER — SODIUM CHLORIDE 0.9 % IJ SOLN: 3.0000 mL | INTRAMUSCULAR | Status: DC | PRN

## 2012-03-18 MED ORDER — SODIUM CHLORIDE 0.9 % IJ SOLN: 10.0000 mL | INTRAMUSCULAR | Status: DC | PRN

## 2012-03-18 MED ORDER — SODIUM CHLORIDE 0.9 % IV BOLUS (SEPSIS)
250.0000 mL | Freq: Once | INTRAVENOUS | Status: AC
  Administered 2012-03-18: 250 mL via INTRAVENOUS

## 2012-03-18 MED ORDER — FAMOTIDINE IN NACL 20-0.9 MG/50ML-% IV SOLN
20.0000 mg | Freq: Once | INTRAVENOUS | Status: AC | PRN
  Filled 2012-03-18: qty 50

## 2012-03-18 MED ORDER — DIPHENHYDRAMINE HCL 50 MG/ML IJ SOLN: 50.0000 mg | Freq: Once | INTRAMUSCULAR | Status: AC | PRN

## 2012-03-18 MED ORDER — METHYLPREDNISOLONE SODIUM SUCC 125 MG IJ SOLR
125.0000 mg | Freq: Once | INTRAMUSCULAR | Status: AC | PRN
  Filled 2012-03-18: qty 2

## 2012-03-18 MED ORDER — SODIUM CHLORIDE 0.9 % IV SOLN: Freq: Once | INTRAVENOUS | Status: AC | PRN

## 2012-03-18 MED ORDER — LORAZEPAM 1 MG PO TABS: 1.0000 mg | ORAL_TABLET | Freq: Four times a day (QID) | ORAL | Status: DC | PRN

## 2012-03-18 MED ORDER — TRASTUZUMAB CHEMO INJECTION 440 MG
2.0000 mg/kg | Freq: Once | INTRAVENOUS | Status: AC
  Administered 2012-03-18: 189 mg via INTRAVENOUS
  Filled 2012-03-18: qty 9

## 2012-03-18 MED ORDER — ACETAMINOPHEN 325 MG PO TABS
650.0000 mg | ORAL_TABLET | Freq: Once | ORAL | Status: AC
  Administered 2012-03-18: 650 mg via ORAL
  Filled 2012-03-18: qty 2

## 2012-03-18 MED ORDER — PROCHLORPERAZINE MALEATE 10 MG PO TABS: 10.0000 mg | ORAL_TABLET | Freq: Four times a day (QID) | ORAL | Status: DC | PRN

## 2012-03-18 MED ORDER — HEPARIN SOD (PORK) LOCK FLUSH 100 UNIT/ML IV SOLN: 250.0000 [IU] | Freq: Once | INTRAVENOUS | Status: AC | PRN

## 2012-03-18 MED ORDER — ALBUTEROL SULFATE (2.5 MG/3ML) 0.083% IN NEBU
2.5000 mg | INHALATION_SOLUTION | Freq: Once | RESPIRATORY_TRACT | Status: AC | PRN
  Filled 2012-03-18: qty 3

## 2012-03-18 NOTE — Progress Notes (Signed)
Patient to receive Herceptin today. I spoke to pharmacy asking them to release Herceptin.  Her chemotherapy (TC) will be held this week. Platelet count not adequate for treatment with Taxotere/carboplatin.   Still awaiting final decision regarding placement. Patient unlikely to go home as home (social) situation poor.  Drue Second, MD Medical/Oncology [facility] [phone] (beeper) [phone] (Office)  03/18/2012, 8:24 AM

## 2012-03-18 NOTE — Progress Notes (Signed)
Patient completed Herceptin.  Patient tolerated well.  Vicki Sanders [facility]  03/18/2012  3:20 PM

## 2012-03-18 NOTE — Progress Notes (Addendum)
[facility] PROGRESS NOTE  Vicki Sanders ZOX:[id number] DOB: 1965-10-09 DOA: 03/06/2012 PCP: Sheila Oats, MD  Brief narrative:  Vicki Sanders is a 46 year old female with a past medical history of stage IV breast cancer, widely metastatic, who began chemotherapy last week with last dose given 03/05/2012 presented to the hospital on 03/06/2012 a chief complaint of fatigue, poor oral intake, and right upper quadrant abdominal pain. Upon initial evaluation emergency department, the patient was found to have transaminitis. An abdominal ultrasound was done which showed a normal bile duct.  At this time SW assisting with D/C planing; per oncology plan to start chemotherapy today.  Assessment/Plan:   Principal Problem:  *Breast cancer metastasized to bone with fatigue, poor oral intake, and failure to thrive status post chemotherapy  Diagnosed 02/14/2012. First chemotherapy with Taxotere and carboplatinum given 02/29/2012 with subsequent dose given 03/05/2012. Also on Herceptin.  Will follow up with oncology in regards to further chemotherapy options  Appreciate palliative care team following  Dietitian, physical therapy, and occupational therapy evaluations performed. SNF recommended.  Active Problems:  Acute blood loss anemia  patient passed large blood clot per rectum 7/12 transfused 1 unit PRBC with Hgb stable at this time Abdominal pain  Unclear etiology, lower abdomen. Urine culture negative for UTI.  CT abdomen and pelvis done 03/10/12, no acute findings.  Diarrhea resolved  Pain control improved with medication adjustment. Epistaxis secondary to thrombocytopenia  Off Lovenox as of 03/10/12.  SCDs for DVT prophylaxis.  Nasal saline PRN.  No further episodes. Mucositis (ulcerative) due to antineoplastic therapy  Continue Carafate and Magic Mouthwash Q 4 hours. Neutropenic fever  Status post Neulasta support as an outpatient.  Unclear etiology of fever, but put on empiric Cipro for  suspected urinary tract infection, which was discontinued on 03/08/12 after urine culture proved negative.  Blood and urine cultures sent, negative to date.  Stool for Clostridium difficile sent, negative.  Empiric antibiotics continued through 03/08/12, discontinued with negative cultures, no return of fever. Urinary tract infection  Urinalysis reviewed and was positive for nitrites, 3-6 white blood cells, and a few bacteria.  Urine cultures sent, negative.  Initially on empiric Cipro which was stopped 03/08/12. Pancytopenia due to chemotherapy  White blood cell count slowly trending down  Platelets and hemoglobin relatively stable; platelet count slowly trending up  Transfused 1 unit PRBC 03/14/2012 with appropriate rise in hemoglobin Transaminitis likely due to liver metastasis  Liver ultrasound consistent with metastatic disease.  No evidence of common bile duct obstruction. No indication for stenting at this time.  LFTs slowly trending down. Hyponatremia  May be related to dehydration versus SIADH in the setting of known malignancy.  stable DM (diabetes mellitus), type 2, uncontrolled with complications secondary to noncompliance  Hemoglobin A1c indicative of poorly controlled diabetes.  moderate scale sliding scale insulin  Seen by Diabetes coordinator on 03/07/12.  Continue carbohydrate modified diet.  Patient reported that she has taken insulin in the past, and knows how to give herself insulin shots, so will likely d/c her on insulin. Acute kidney injury  Baseline creatinine 0.63 on 02/20/2012. Current creatinine elevated over her usual baseline values, and likely due to a prerenal etiology.  Continue to hydrate and monitor creatinine  Severe Protein calorie malnutrition / Obesity BMI 35.2  Poor protein stores in the setting of decreased oral intake secondary to cancer.  Dietitian consultation performed 03/07/12.  Code Status: Full  Family Communication: Boyfriend, Vicki Sanders, updated at  bedside 03/11/12; daughters updated at bedside 03/16/2012  Disposition Plan: SNF placement - when stable  Medical Consultants:  Dr. Drue Second, Oncology  Palliative Care Team Other consultants:  Diabetes coordinator  Dietitian  Physical therapy:SNF recommended. Antibiotics:  Cipro 03/06/2012--->03/08/12  Flagyl 03/07/12--->03/08/12  Manson Passey, MD  [facility] Pager [phone]  If 7PM-7AM, please contact night-coverage [email or web address] Password [facility] 03/18/2012, 2:54 PM   LOS: 12 days   HPI/Subjective: No acute events overnight.  Objective: Filed Vitals:   03/18/12 0452 03/18/12 0521 03/18/12 0804 03/18/12 1423  BP: 100/56 90/58 100/60 120/70  Pulse: 96   94  Temp: 98.5 F (36.9 C)   98.4 F (36.9 C)  TempSrc: Oral   Oral  Resp: 18   22  Height:      Weight:      SpO2: 98%   99%    Intake/Output Summary (Last 24 hours) at 03/18/12 1454 Last data filed at 03/18/12 4782  Gross per 24 hour  Intake 1244.33 ml  Output    650 ml  Net 594.33 ml    Exam:   General:  Pt is alert, follows commands appropriately, not in acute distress; jaundiced  Cardiovascular: Regular rate and rhythm, S1/S2, no murmurs, no rubs, no gallops  Respiratory: Clear to auscultation bilaterally, no wheezing, no crackles, no rhonchi  Abdomen: Soft, non tender, non distended, bowel sounds present, no guarding  Extremities: No edema, pulses DP and PT palpable bilaterally  Neuro: Grossly nonfocal  Data Reviewed: Basic Metabolic Panel:  Lab 03/18/12 9562 03/17/12 0805 03/16/12 0500 03/15/12 0345 03/14/12 1551  NA 136 135 136 134* 133*  K 4.0 3.9 3.8 4.0 3.8  CL 109 107 107 105 105  CO2 18* 19 19 19 19   GLUCOSE 241* 176* 186* 176* 166*  BUN 35* 35* 33* 33* 33*  CREATININE 1.28* 1.18* 1.09 1.04 1.06  CALCIUM 8.6 8.4 8.3* 8.2* 8.2*  MG -- -- -- -- --  PHOS -- -- -- -- --   Liver Function Tests:  Lab 03/15/12 0345 03/14/12 1551  AST 194* 202*  ALT 63* 65*  ALKPHOS 395*  422*  BILITOT 10.3* 10.1*  PROT 4.6* 4.7*  ALBUMIN 1.4* 1.4*   No results found for this basename: LIPASE:5,AMYLASE:5 in the last 168 hours No results found for this basename: AMMONIA:5 in the last 168 hours CBC:  Lab 03/18/12 0523 03/17/12 0805 03/16/12 0500 03/15/12 0345 03/14/12 0434  WBC 10.6* 10.7* 11.7* 13.5* 11.0*  NEUTROABS -- -- -- -- --  HGB 8.9* 9.2* 9.4* 10.3* 7.1*  HCT 27.2* 27.3* 28.3* 30.0* 22.1*  MCV 90.7 88.3 88.4 86.7 89.8  PLT 92* 65* 52* 35* 27*   Cardiac Enzymes: No results found for this basename: CKTOTAL:5,CKMB:5,CKMBINDEX:5,TROPONINI:5 in the last 168 hours BNP: No components found with this basename: POCBNP:5 CBG:  Lab 03/18/12 1147 03/18/12 0736 03/17/12 2051 03/17/12 1718 03/17/12 1158  GLUCAP 259* 218* 223* 184* 167*    No results found for this or any previous visit (from the past 240 hour(s)).   Studies: No results found.  Scheduled Meds:   . sodium chloride   Intravenous Once  . acetaminophen  650 mg Oral Once  . antiseptic oral rinse  15 mL Mouth Rinse q12n4p  . diphenhydrAMINE  50 mg Oral Once  . fentaNYL  112.5 mcg Transdermal Q72H  . insulin aspart  0-15 Units Subcutaneous TID WC  . insulin aspart  0-5 Units Subcutaneous QHS  . magic mouthwash w/lidocaine  10 mL Oral 5 X Daily  . ondansetron  4 mg Intravenous TID   Or  . ondansetron  4 mg Oral TID  . pantoprazole  40 mg Oral Q1200  . polyethylene glycol  17 g Oral Daily  . sodium chloride  250 mL Intravenous Once  . sodium chloride  500 mL Intravenous Once  . sucralfate  1 g Oral TID WC & HS  . trastuzumab (HERCEPTIN) CHEMO IV infusion  2 mg/kg (Treatment Plan Actual) Intravenous Once   Continuous Infusions:   . 0.9 % NaCl with KCl 20 mEq / L 20 mL/hr at 03/17/12 1500

## 2012-03-18 NOTE — Progress Notes (Addendum)
Patient JY:[id number] Wilk      DOB: 1966-05-21      AOZ:[id number]   Palliative Medicine Team at [facility] Progress Note    Subjective: " My mouth is feeling much better, it's still a little sore on the sides." "I am going to get my Herceptin infusion today."   Filed Vitals:   03/18/12 1423  BP: 120/70  Pulse: 94  Temp: 98.4 F (36.9 C)  Resp: 22   Physical exam:  General: Alert/verbally responsive, appears comfortable HEENT:tongue surface pinkish in color, ulcer on lateral posterior R tongue surface Chest: CTA, diminished at bases  CVS: RRR, no MGR  Abdomen:soft, slightly tender lower abdomen, BS audible  Ext: no edema  Neuro:oreinted x 3  Assessment and plan: 46 year old WF with history of Stg IV breast cancer (HER2 +) diagnosed in 02/2012, completed one cycle of Taxotere/Carbo 6/13, and Herceptin 7/2. Presented to ER c/o fatigue, n/v, R upper abdominal pain and poor oral intake  1. Code Status: DNR/DNI  2.Symptom Control:     Nausea: few bouts of nausea and vomiting over the weekend, none today.     Anorexia: oral intake improved with Regular Diet    Mucositis: using Magic Swizzle prior to meals, which helps. Ordered Normal Saline swish and spit as needed, instructed patient on use after meals     and at bedtime.Marland Kitchen Hopefully this will help promote healing of mucositis.    Pain: abdominal pain controlled well with Fentanyl patch, has not needed any prn Oxycodone    Bowel Regime: currently on hold , due to prior bouts of diarrhea, Herceptin can cause diarrhea, bowel regime will be re-instituted after evaluation of response to Herceptin. 3. Psychosocial: emotional support to patient, she understands that prognosis is poor, wishes to continue treatment at this time 4. Disposition: patient verbalized that she spoke with daughter and understands there is a possible plan in place for disposition to SNF closer to daughter in [place]. Plan to meet with patient and daughter  tomorrow at 3 pm, hopefully social work can attend to answer any patient questions.  Time In Time Out Total Time Spent with Patient Total Overall Time  2:00p 2:30p 30 min 30 min   Greater than 50%  of this time was spent counseling and coordinating care related to the above assessment and plan.  Freddie Breech, CNS-C Palliative Medicine Team [facility] Team Phone: [phone] Pager: [phone]

## 2012-03-18 NOTE — Progress Notes (Signed)
CSW spoke with pt's dtr re: dispo.  Pt's dtr has contacted DSS for assist with placement.  Pt's dtr interested in [facility]. CSW spoke with admissions coordinator for [facility], Gavin Pound [phone], who will review clinical information sent today and f/u with CSW tomorrow.  Plan on attending meeting with Palliative NP tomorrow at 3pm. Dellie Burns, MSW, LCSWA [phone] (coverage)

## 2012-03-18 NOTE — Progress Notes (Signed)
UR done. 

## 2012-03-18 NOTE — Progress Notes (Signed)
Patient due to receive Herceptin today.  Patient has received this medication before.  She did not want any additional educational information related to Herceptin.  Patient agreed to medication and signed consent form.  Allayne Butcher [facility]  03/18/2012

## 2012-03-18 NOTE — Progress Notes (Signed)
Pt awake and alert this morning. Reports she feels better this AM. BP checked this AM. Result of 90/58. Calm and cooperative. Emotional support given

## 2012-03-18 NOTE — Progress Notes (Signed)
CSW spoke with pt's dtr/HCPOA, [name] [phone] (w), re: SNF choice.  Pt's dtr has accepted offer from [facility] as this is close to her home. Pt's dtr reports current plan is for pt and pt's minor daughter (45 y/o) to move to her home (pt to move in after SNF stay). CSW faxed updated clinicals to [facility] admissions for DON review (last update they received was 7/9) Awaiting return call from [facility] re ability to still extend offer. Family meeting tomorrow with Palliative NP at 3pm. CSW will continue to follow and assist with appropriate d/c plan. Dellie Burns, MSW, [place] [phone] (coverage)

## 2012-03-18 NOTE — Progress Notes (Signed)
PT Cancellation Note  Treatment cancelled today due to pt has been nauseous with vomiting today that increases with movement.  She is also going to receive chemotherapy in a few minutes.  PT deferred today  [name] 03/18/2012, 1:54 PM

## 2012-03-18 NOTE — Progress Notes (Signed)
Dr. Burnadette Peter notified of manual pressure of 90/58. New orders given

## 2012-03-19 ENCOUNTER — Ambulatory Visit: Payer: Self-pay | Admitting: Oncology

## 2012-03-19 ENCOUNTER — Ambulatory Visit: Payer: Self-pay

## 2012-03-19 ENCOUNTER — Other Ambulatory Visit: Payer: Self-pay | Admitting: Lab

## 2012-03-19 LAB — BASIC METABOLIC PANEL
BUN: 37 mg/dL — ABNORMAL HIGH (ref 6–23)
CO2: 17 mEq/L — ABNORMAL LOW (ref 19–32)
Chloride: 111 mEq/L (ref 96–112)
Creatinine, Ser: 1.56 mg/dL — ABNORMAL HIGH (ref 0.50–1.10)
GFR calc Af Amer: 45 mL/min — ABNORMAL LOW (ref >90)
Potassium: 4.3 mEq/L (ref 3.5–5.1)

## 2012-03-19 LAB — CBC
HCT: 22.7 % — ABNORMAL LOW (ref 36.0–46.0)
MCV: 90.4 fL (ref 78.0–100.0)
Platelets: 103 10*3/uL — ABNORMAL LOW (ref 150–400)
RBC: 2.51 MIL/uL — ABNORMAL LOW (ref 3.87–5.11)
RDW: 18.7 % — ABNORMAL HIGH (ref 11.5–15.5)
WBC: 8.9 10*3/uL (ref 4.0–10.5)

## 2012-03-19 LAB — PREPARE RBC (CROSSMATCH)

## 2012-03-19 LAB — GLUCOSE, CAPILLARY
Glucose-Capillary: 141 mg/dL — ABNORMAL HIGH (ref 70–99)
Glucose-Capillary: 184 mg/dL — ABNORMAL HIGH (ref 70–99)
Glucose-Capillary: 164 mg/dL — ABNORMAL HIGH (ref 70–99)

## 2012-03-19 MED ORDER — SODIUM CHLORIDE 0.9 % IV BOLUS (SEPSIS)
250.0000 mL | Freq: Once | INTRAVENOUS | Status: AC
  Administered 2012-03-19: 250 mL via INTRAVENOUS

## 2012-03-19 NOTE — Progress Notes (Signed)
Per Stanton Kidney at [facility], unable to extend bed offer as unable to meet pt's needs. CSW updated pt's dtr who reports will accept [facility] offer.  Admissions coordinator at [facility] [phone], requesting treatment center for chemo be switched to a  [place] facility. PMT meeting today at 3pm. CSW will continue to follow and assist with SNF dispo. Dellie Burns, MSW, [place] [phone] (coverage)

## 2012-03-19 NOTE — Progress Notes (Signed)
[facility] PROGRESS NOTE  Vicki Sanders WGN:[id number] DOB: [birth year] DOA: 03/06/2012 PCP: Sheila Oats, MD  Brief narrative:  Mrs. 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 Patient was admitted 03/06/2012 with complaints of right upper quadrant abdominal pain and poor oral intake. Based on CT abdomen/pelvis and abdominal ultrasound no evidence of biliary duct dilatation but definitely with evidence of liver and bone metastases.  Hospital course complicated with acute blood loss anemia (patient has passed large blood clot per rectum 03/15/2012) for which she required 1 unit of PRBC transfusion (hemoglobin increased from 7.1 to 10.3). Please note that patient's hemoglobin dropped today to 7.6 but there were no complaints of blood per rectum (per patient she reports she had a menstrual bleed). Patient will be transfused 2 units of PRBC today 03/19/2012. Additionally, in past 24 hours patient remains hypotensive with SBP < 90's even with IV fluid challenges. Palliative care team has met with the patient but the official meeting for goals of care has not been completed, plan is for today at 3 pm and will follow up on the recommendations. At this time the plan is to repeat CBC after transfusion to ensure stable hemoglobin prior to discharge and hopefully her BP will improve even with this regimen. Per SW, there is a bed available at SNF which was of patient's and family choice.  Assessment/Plan:   Principal Problem:  *Breast cancer metastasized to bone with fatigue, poor oral intake, and failure to thrive status post chemotherapy  Diagnosed 02/14/2012. First chemotherapy with Taxotere and carboplatinum  given 02/29/2012 with subsequent dose given 03/05/2012.  Herceptin given 03/18/2012, per oncology patient will receive next dose in about 1 week from today In my conversation with the patient was receptive to continuing chemotherapy. While she is aware that she has an advanced carcinoma at the same time she wants to pursue whatever treatment options she has in hopes to spend more time with her kids. Palliative care team is also involved and plan is for goals of care meeting today - will follow up Pain regimen: morphine 2 mg Q 2 hours PRN IV and OxyIR 5-10 mg Q 4 hours PRN, fentanyl patch Q 72 hours  Active Problems:  Acute blood loss anemia  patient passed large blood clot per rectum 03/15/2012 transfused 1 unit PRBC 03/15/2012 but hemoglobin at 7.6 today Will transfuse another 2 units PRBC today 03/19/2012 Follow up CBC  In am Abdominal pain  Unclear etiology, lower abdomen. Urine culture negative for UTI.  CT abdomen and pelvis done 03/10/12, no acute findings.  Pain improved with the above pain medication regimen Epistaxis secondary to thrombocytopenia  No further episodes of epistaxis Off Lovenox as of 03/10/12.  SCDs for DVT prophylaxis.  Nasal saline PRN.  Mucositis (ulcerative) due to antineoplastic therapy  Continue Carafate and Magic Mouthwash Q 4 hours. Neutropenic fever  Status post Neulasta support as an outpatient.  Unclear etiology of fever, patient was on empiric Cipro for suspected urinary tract infection, which was discontinued on 03/08/12 after urine culture proved negative.  Blood and urine cultures negative to date.  Stool for Clostridium difficile negative.  Urinary tract infection  Urine culture negative to date  Initially on empiric Cipro which was stopped 03/08/12.  Pancytopenia due to chemotherapy  White blood cell count 8.9 today Platelet count slowly trending up, 103 today In regards to hemoglobin with today's transfusion patient will receive total of 3 units PRBC since  admission Transaminitis likely due to liver metastasis  Liver ultrasound consistent with metastatic disease.  No evidence of common bile duct obstruction. No indication for stenting at this time.  LFTs slowly trending down. Hyponatremia  May be related to dehydration versus SIADH in the setting of known malignancy.  stable DM (diabetes mellitus), type 2, uncontrolled with complications secondary to noncompliance  Hemoglobin A1c indicative of poorly controlled diabetes.  moderate scale sliding scale insulin  Seen by Diabetes coordinator on 03/07/12.  Continue carbohydrate modified diet. . Acute kidney injury  Baseline creatinine 0.63 on 02/20/2012.  Unsure why creatinine is trending up, perhaps it has to do with dehydration, hypotension Continue to hydrate and monitor creatinine  Severe Protein calorie malnutrition / Obesity BMI 35.2  Poor protein stores in the setting of decreased oral intake secondary to cancer.  Dietitian consultation performed 03/07/12.  Code Status: Full  Family Communication: Boyfriend, Kandis Mannan, updated at bedside 03/17/12; daughters updated at bedside 03/17/2012  Disposition Plan: SNF placement - when stable   Medical Consultants:  Dr. Drue Second, Oncology  Palliative Care Team Other consultants:  Diabetes coordinator  Dietitian  Physical therapy:SNF recommended. Antibiotics:  Cipro 03/06/2012--->03/08/12  Flagyl 03/07/12--->03/08/12  Manson Passey, MD  [facility] Pager [phone]  If 7PM-7AM, please contact night-coverage [email or web address] Password [facility] 03/19/2012, 12:49 PM   LOS: 13 days   HPI/Subjective: No acute events overnight.  Objective: Filed Vitals:   03/19/12 0420 03/19/12 0600 03/19/12 0602 03/19/12 1245  BP: 86/40 88/42 84/42  88/48  Pulse: 97 99 100 92  Temp:  98 F (36.7 C)    TempSrc:  Oral    Resp:      Height:      Weight:      SpO2:  99%      Intake/Output Summary (Last 24 hours) at 03/19/12 1249 Last data filed at  03/19/12 0559  Gross per 24 hour  Intake 1740.33 ml  Output    401 ml  Net 1339.33 ml    Exam:   General:  Pt is alert, follows commands appropriately, not in acute distress; jaundiced  Cardiovascular: Regular rate and rhythm, S1/S2, no murmurs, no rubs, no gallops  Respiratory: Clear to auscultation bilaterally, no wheezing, no crackles, no rhonchi  Abdomen: Soft, tender to palpation in med abdomen without rebound tenderness or guarding, non distended, bowel sounds present, no guarding  Extremities: No edema, pulses DP and PT palpable bilaterally  Neuro: Grossly nonfocal  Data Reviewed: Basic Metabolic Panel:  Lab 03/19/12 7846 03/18/12 0523 03/17/12 0805 03/16/12 0500 03/15/12 0345  NA 137 136 135 136 134*  K 4.3 4.0 3.9 3.8 4.0  CL 111 109 107 107 105  CO2 17* 18* 19 19 19   GLUCOSE 178* 241* 176* 186* 176*  BUN 37* 35* 35* 33* 33*  CREATININE 1.56* 1.28* 1.18* 1.09 1.04  CALCIUM 7.7* 8.6 8.4 8.3* 8.2*   Liver Function Tests:  Lab 03/15/12 0345 03/14/12 1551  AST 194* 202*  ALT 63* 65*  ALKPHOS 395* 422*  BILITOT 10.3* 10.1*  PROT 4.6* 4.7*  ALBUMIN 1.4* 1.4*   CBC:  Lab 03/19/12 1135 03/18/12 0523 03/17/12 0805 03/16/12 0500 03/15/12 0345  WBC 8.9 10.6* 10.7* 11.7* 13.5*  HGB 7.6* 8.9* 9.2* 9.4* 10.3*  HCT 22.7* 27.2* 27.3* 28.3* 30.0*  MCV 90.4 90.7 88.3 88.4 86.7  PLT 103* 92* 65* 52* 35*   CBG:  Lab 03/19/12 1204 03/19/12 0812 03/18/12 2108 03/18/12 1651 03/18/12 1147  GLUCAP 184* 141* 190* 181* 259*    Studies: No results found.  Scheduled Meds:   . acetaminophen  650 mg Oral Once  . diphenhydrAMINE  50 mg Oral Once  . fentaNYL  112.5 mcg Transdermal Q72H  . insulin aspart  0-15 Units Subcutaneous TID WC  . insulin aspart  0-5 Units Subcutaneous QHS  . magic mouthwash w/lidoc  10 mL Oral 5 X Daily  . ondansetron  4 mg Intravenous TID   Or  . ondansetron  4 mg Oral TID  . pantoprazole  40 mg Oral Q1200  . polyethylene glycol  17 g Oral  Daily  . sucralfate  1 g Oral TID WC & HS  . trastuzumab CHEMO infusion  2 mg/kg  Intravenous Once   Continuous Infusions:   . 0.9 % NaCl with KCl 20 mEq / L 100 mL/hr at 03/19/12 1610

## 2012-03-19 NOTE — Progress Notes (Signed)
Initial visit with pt in response to spiritual care consult.    Brief encounter d/t Pt's family at bedside during visit.   Introduced spiritual care as resource.  Pt's family (father and two daughters) had just arrived and pt wished to visit with family.  Pt appreciative of visit and wished for follow up.    Spiritual care will continue to follow and provide support.   [name]  [place], [place]    03/19/12 1400  Clinical Encounter Type  Visited With Patient;Family;Patient and family together  Visit Type Initial;Psychological support;Spiritual support;Social support  Referral From Nurse  Consult/Referral To Chaplain;Nurse  Recommendations Follow up for continued care  Stress Factors  Patient Stress Factors Major life changes;Health changes

## 2012-03-19 NOTE — Progress Notes (Signed)
PT Cancellation Note  ___Treatment cancelled today due to medical issues with patient which prohibited   therapy  _x__ Treatment cancelled today due to patient receiving procedure --to get blood now, still w/ nausea  ___ Treatment cancelled today due to patient's refusal to participate   ___ Treatment cancelled today due to    Raoul Ciano hillPT [phone]

## 2012-03-19 NOTE — Progress Notes (Addendum)
Patient Vicki Sanders      DOB: 1966-08-27      XLK:[id number]   Palliative Medicine Team at [facility] Progress Note    Subjective: "I am feeling pretty good today."  Family at bedside: Patients father, daughters, Vicki Sanders and Vicki Sanders.   Filed Vitals:   03/19/12 1530  BP: 75/41  Pulse: 94  Temp: 98.1 F (36.7 C)  Resp: 20   Physical exam:  General: Alert/verbally responsive, appears comfortable  HEENT:tongue surface pinkish in color, no obvious ulcerations noted Chest: CTA, diminished at bases  CVS: RRR, no MGR  Abdomen:soft, slightly tender lower abdomen, BS audible  Ext: no edema  Neuro:oreinted x 3  Assessment and plan:  46 year old WF with history of Stg IV breast cancer (HER2 +) diagnosed in 02/2012, completed one cycle of Taxotere/Carbo 6/13, and Herceptin 7/2 and on 7/14. Presented to ER c/o fatigue, n/v, R upper abdominal pain and poor oral intake.  1. Code Status: DNR/DNI  2.Symptom Control:  Nausea: no nausea/vomiting for past few days, receiving scheduled anti-emetic, also has as needed med ordered Anorexia: oral intake improved Mucositis: using Magic Swizzle prior to meals, which helps. Normal Saline swish/spit being used by patient prior to meals and as needed. Feels that it has helped relieve tenderness in mouth.   Pain: abdominal pain controlled well with Fentanyl patch, has not needed any prn Oxycodone  Constipation: started back on daily Miralax, effective for bowel movement yesterday..  3. Psychosocial: emotional support to patient, she understands that prognosis is poor, wishes to continue treatment at this time  4. Disposition: Discussed plans for discharge with Dellie Burns SW, patient, and daughter Vicki Sanders. Plans are for possible disposition to [facility] in [place] in next day or so. Patient verbalized desire to continue cancer treatment under the care of Dr Park Breed. Social worker will contact Dr Park Breed, patient requesting to see her prior to  discharge to discuss follow up plans. Discussed and completed a Medical Orders for Scope of Treatment form with patient, which was placed on chart with [facility] DNR form. We also talked if patient decides to want only comfort care in the future that hospice services can be illicited.  Time In Time Out Total Time Spent with Patient Total Overall Time  3:00p 4:00p 60 min 60 min   Greater than 50%  of this time was spent counseling and coordinating care related to the above assessment and plan.  Freddie Breech, CNS-C Palliative Medicine Team [facility] Team Phone: [phone] Pager: [phone]

## 2012-03-19 NOTE — Progress Notes (Signed)
PMT meeting today with pt and pt's dtr, [name]. Pt would like to continue treatment with Dr. Welton Flakes instead of transfer to oncologist in [place].  CSW spoke with [name] [phone], admissions coordinator at [facility], who reports SNF will provide transport to oncology appointments at [facility]. CSW will continue to follow for transfer to SNF when stable.  Dellie Burns, MSW, [place] [phone] (coverage)

## 2012-03-20 ENCOUNTER — Ambulatory Visit: Payer: Self-pay

## 2012-03-20 DIAGNOSIS — R112 Nausea with vomiting, unspecified: Secondary | ICD-10-CM

## 2012-03-20 DIAGNOSIS — D62 Acute posthemorrhagic anemia: Secondary | ICD-10-CM | POA: Diagnosis present

## 2012-03-20 DIAGNOSIS — I959 Hypotension, unspecified: Secondary | ICD-10-CM | POA: Diagnosis present

## 2012-03-20 LAB — COMPREHENSIVE METABOLIC PANEL
AST: 198 U/L — ABNORMAL HIGH (ref 0–37)
Albumin: 1.4 g/dL — ABNORMAL LOW (ref 3.5–5.2)
Alkaline Phosphatase: 327 U/L — ABNORMAL HIGH (ref 39–117)
BUN: 37 mg/dL — ABNORMAL HIGH (ref 6–23)
Chloride: 108 mEq/L (ref 96–112)
Potassium: 4.2 mEq/L (ref 3.5–5.1)
Sodium: 134 mEq/L — ABNORMAL LOW (ref 135–145)
Total Bilirubin: 8.8 mg/dL — ABNORMAL HIGH (ref 0.3–1.2)
Total Protein: 4.6 g/dL — ABNORMAL LOW (ref 6.0–8.3)

## 2012-03-20 LAB — GLUCOSE, CAPILLARY
Glucose-Capillary: 162 mg/dL — ABNORMAL HIGH (ref 70–99)
Glucose-Capillary: 187 mg/dL — ABNORMAL HIGH (ref 70–99)
Glucose-Capillary: 231 mg/dL — ABNORMAL HIGH (ref 70–99)

## 2012-03-20 LAB — CBC
HCT: 30.7 % — ABNORMAL LOW (ref 36.0–46.0)
MCHC: 33.2 g/dL (ref 30.0–36.0)
Platelets: 99 10*3/uL — ABNORMAL LOW (ref 150–400)
RDW: 19.8 % — ABNORMAL HIGH (ref 11.5–15.5)
WBC: 9.1 10*3/uL (ref 4.0–10.5)

## 2012-03-20 MED ORDER — HEPARIN SOD (PORK) LOCK FLUSH 100 UNIT/ML IV SOLN
500.0000 [IU] | INTRAVENOUS | Status: AC | PRN
  Administered 2012-03-20: 500 [IU]
  Filled 2012-03-20: qty 5

## 2012-03-20 MED ORDER — POLYETHYLENE GLYCOL 3350 17 G PO PACK: 17.0000 g | PACK | Freq: Every day | ORAL | Status: AC

## 2012-03-20 MED ORDER — FENTANYL 50 MCG/HR TD PT72: MEDICATED_PATCH | TRANSDERMAL | Status: AC

## 2012-03-20 MED ORDER — INSULIN ASPART 100 UNIT/ML ~~LOC~~ SOLN: 0.0000 [IU] | Freq: Three times a day (TID) | SUBCUTANEOUS | Status: AC

## 2012-03-20 MED ORDER — OXYCODONE HCL 5 MG PO TABS: 5.0000 mg | ORAL_TABLET | ORAL | Status: AC | PRN

## 2012-03-20 MED ORDER — DIAZEPAM 5 MG PO TABS: 5.0000 mg | ORAL_TABLET | Freq: Four times a day (QID) | ORAL | Status: AC | PRN

## 2012-03-20 MED ORDER — INSULIN ASPART 100 UNIT/ML ~~LOC~~ SOLN: 0.0000 [IU] | Freq: Every day | SUBCUTANEOUS | Status: AC

## 2012-03-20 MED ORDER — DSS 100 MG PO CAPS: 100.0000 mg | ORAL_CAPSULE | Freq: Two times a day (BID) | ORAL | Status: AC | PRN

## 2012-03-20 NOTE — Progress Notes (Addendum)
[facility] PROGRESS NOTE  Vicki Sanders ZOX:[id number] DOB: 02/09/1966 DOA: 03/06/2012 PCP: Sheila Oats, MD  Assessment/Plan: Principal Problem:  *Breast cancer metastasized to bone with fatigue, poor oral intake, and failure to thrive status post chemotherapy  Diagnosed 02/14/2012. First chemotherapy with Taxotere and carboplatinum given 02/29/2012 with subsequent dose given 03/05/2012. Also on Herceptin.  Herceptin given on 03/18/12, given weekly. Oncology following.  PCT has met with patient again 03/19/12, patient now DNR/DNI. Dietitian, physical therapy, and occupational therapy evaluations performed. SNF recommended. Active Problems:  Hypotension  BP low, but mentating well.  Continue IVF.  No evidence of recurrent GIB. Acute blood loss anemia  Patient passed large blood clot per rectum 03/15/2012. Transfused 1 unit PRBC 03/15/2012 with appropriate rise in hemoglobin.  Hemoglobin dropped to 7.6 03/19/12, given 2 additional units of PRBCs. Post transfusion hemoglobin 10.2. Abdominal pain  Unclear etiology, but likely from liver metastasis. Urine culture negative for UTI.  CT abdomen and pelvis done 03/10/12, no acute findings.  Diarrhea improving.  Continue pain control with fentanyl patch, OxyIR, and morphine as needed. Epistaxis secondary to thrombocytopenia  Off Lovenox as of 03/10/12.  SCDs for DVT prophylaxis.  Nasal saline PRN.  No further episodes. Mucositis (ulcerative) due to antineoplastic therapy  Continue Carafate and Magic Mouthwash Q 4 hours. Neutropenic fever  Status post Neulasta support as an outpatient.  Unclear etiology of fever, but put on empiric Cipro for suspected urinary tract infection, which was discontinued on 03/08/12 after urine culture proved negative.  Blood and urine cultures sent, negative to date.  Stool for Clostridium difficile sent, negative.  Empiric antibiotics continued through 03/08/12, discontinued with negative cultures, no return of  fever. Urinary tract infection  Urinalysis reviewed and was positive for nitrites, 3-6 white blood cells, and a few bacteria.  Urine cultures sent, negative.  Initially on empiric Cipro which was stopped 03/08/12. Pancytopenia due to chemotherapy  White blood cell count normalized.  Platelets and hemoglobin stable.  Continue to monitor closely. Transaminitis likely due to liver metastasis  Liver ultrasound consistent with metastatic disease.  No evidence of common bile duct obstruction. No indication for stenting at this time.  LFTs trending down. Hyponatremia  May be related to dehydration versus SIADH in the setting of known malignancy.  Normalized with IVF. DM (diabetes mellitus), type 2, uncontrolled with complications  Hemoglobin A1c indicative of poorly controlled diabetes.  Placed on moderate scale sliding scale insulin 03/07/12. CBGs 141-187 over past 24 hours.  Seen by Diabetes coordinator on 03/07/12.  Continue carbohydrate modified diet.  Acute kidney injury  Baseline creatinine 0.63 on 02/20/2012. Current creatinine elevated over her usual baseline values, and likely due to a prerenal etiology.  Continue to hydrate and monitor creatinine, which is slowly improving.  Severe Protein calorie malnutrition / Obesity BMI 35.2  Poor protein stores in the setting of decreased oral intake secondary to cancer.  Dietitian consultation performed 03/07/12.  Code Status: DNR Family Communication: None at bedside. Disposition Plan: SNF in next 24-48 hours.   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 Patient was admitted 03/06/2012 with complaints of right upper quadrant abdominal pain and poor oral intake. Based on CT abdomen/pelvis and abdominal ultrasound, there was no evidence of  biliary duct dilatation but  evidence of liver and bone metastases.  Hospital course complicated with acute blood  loss anemia (patient passed large blood clot per rectum 03/15/2012) for which she required 1 unit of PRBC transfusion (hemoglobin increased from 7.1 to 10.3). The patient's hemoglobin dropped to 7.6 03/19/12, but there were no complaints of blood per rectum (per patient she reports she had a menstrual bleed), and was transfused 2 units of blood. The patient remains hypotensive with SBP < 90's even with IV fluid challenges. The palliative care team initially met the patient on 03/14/2012, mainly for symptom control.  A followup meeting was held on 03/19/2012 and she was changed to DO NOT RESUSCITATE.  Per SW, there is a bed available at SNF which was of patient's and family choice.   Consultants:  Dr. Drue Second, Oncology  Dr. Derenda Mis, Palliative Care  Other consultants:   Diabetes coordinator   Dietitian   Physical therapy:SNF recommended.  Procedures:  None  Antibiotics: Cipro 03/06/2012--->03/08/12  Flagyl 03/07/12--->03/08/12  HPI/Subjective: Vicki Sanders is constipated. She took some MiraLAX this morning. The patient states that her pain is under control and that she really has not had any pain since she passed bloody stool several days ago. No nausea or vomiting. Her mouth sores are better.  Objective: Filed Vitals:   03/20/12 0142 03/20/12 0245 03/20/12 0302 03/20/12 0620  BP: 89/57 94/60 97/64  84/40  Pulse: 92 94 91 92  Temp: 98.5 F (36.9 C) 98.5 F (36.9 C) 98.2 F (36.8 C) 98.3 F (36.8 C)  TempSrc: Oral Oral Oral Oral  Resp: 16 16 18 16   Height:      Weight:      SpO2:    99%    Intake/Output Summary (Last 24 hours) at 03/20/12 1003 Last data filed at 03/20/12 0745  Gross per 24 hour  Intake 3861.5 ml  Output      0 ml  Net 3861.5 ml    Exam: Gen:  NAD Cardiovascular:  RRR, No M/R/G Respiratory: Lungs CTAB Gastrointestinal: Abdomen soft,  NT/ND with normal active bowel sounds. Extremities: No C/E/C  Data Reviewed: Basic Metabolic Panel:  Lab 03/19/12 1610 03/18/12 0523 03/17/12 0805 03/16/12 0500 03/15/12 0345  NA 137 136 135 136 134*  K 4.3 4.0 -- -- --  CL 111 109 107 107 105  CO2 17* 18* 19 19 19   GLUCOSE 178* 241* 176* 186* 176*  BUN 37* 35* 35* 33* 33*  CREATININE 1.56* 1.28* 1.18* 1.09 1.04  CALCIUM 7.7* 8.6 8.4 8.3* 8.2*  MG -- -- -- -- --  PHOS -- -- -- -- --   GFR Estimated Creatinine Clearance: 50.5 ml/min (by C-G formula based on Cr of 1.56). Liver Function Tests:  Lab 03/15/12 0345 03/14/12 1551  AST 194* 202*  ALT 63* 65*  ALKPHOS 395* 422*  BILITOT 10.3* 10.1*  PROT 4.6* 4.7*  ALBUMIN 1.4* 1.4*   Coagulation profile  Lab 03/14/12 1551  INR 1.22  PROTIME --    CBC:  Lab 03/20/12 0935 03/19/12 1135 03/18/12 0523 03/17/12 0805 03/16/12 0500  WBC PENDING 8.9 10.6* 10.7* 11.7*  NEUTROABS -- -- -- -- --  HGB 10.2* 7.6* 8.9* 9.2* 9.4*  HCT 30.7* 22.7* 27.2* 27.3* 28.3*  MCV 88.7 90.4 90.7 88.3 88.4  PLT 99* 103* 92* 65* 52*   CBG:  Lab 03/20/12 0741 03/19/12 2240 03/19/12 1724 03/19/12 1204 03/19/12 0812  GLUCAP 187* 162* 164* 184* 141*   Studies:  Dg Chest 2 View 03/06/2012 IMPRESSION:  1.  Left IJ Port-A-Cath is in satisfactory and stable position. 2.  Low lung volumes. 3.  No acute cardiopulmonary disease.  Original Report Authenticated By: Jamesetta Orleans. MATTERN, M.D.    US Abdomen Complete 03/06/2012 IMPRESSION: Heterogeneous liver echogenicity with nodular contour, most in keeping with known metastatic disease.  Original Report Authenticated By: Waneta Martins, M.D.    Ct Abdomen Pelvis W Contrast 03/10/2012 IMPRESSION:  1.  Innumerable liver metastases as noted previously. 2.  Widespread osseous metastatic disease as noted previously. 3.  Small amount of ascites, new since the prior examinations from May, 2013. 4.  Lack of excretion of the intravenous contrast into the collecting  systems of the kidneys raises the question of renal toxicity of the chemotherapeutic agent.  Clinical correlation. 5.  Anasarca.  6.  Liquid stool throughout the colon without evidence of colonic wall thickening to confirm colitis. 7.  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  Original Report Authenticated By: Arnell Sieving, M.D.   Scheduled Meds:    . sodium chloride   Intravenous Once  . antiseptic oral rinse  15 mL Mouth Rinse q12n4p  . fentaNYL  112.5 mcg Transdermal Q72H  . insulin aspart  0-15 Units Subcutaneous TID WC  . insulin aspart  0-5 Units Subcutaneous QHS  . magic mouthwash w/lidocaine  10 mL Oral 5 X Daily  . ondansetron  4 mg Intravenous TID   Or  . ondansetron  4 mg Oral TID  . pantoprazole  40 mg Oral Q1200  . polyethylene glycol  17 g Oral Daily  . sucralfate  1 g Oral TID WC & HS   Continuous Infusions:    . 0.9 % NaCl with KCl 20 mEq / L 100 mL/hr at 03/20/12 0523    Time spent: 35 minutes   LOS: 14 days   RAMA,CHRISTINA  [facility] Pager [phone].  If 8PM-8AM, please contact night-coverage at [email or web address], password [facility] 03/20/2012, 10:03 AM  LOS: 14 days

## 2012-03-20 NOTE — Discharge Summary (Addendum)
Physician Discharge Summary  Clytee Heinrich ZOX:[id number] DOB: [birth year] DOA: 03/06/2012  PCP: Sheila Oats, MD  Admit date: 03/06/2012 Discharge date: 03/20/2012  Recommendations for Outpatient Follow-up:  1. F/U with Dr. Welton Flakes next week at scheduled appointment on 03/26/12.  Discharge Diagnoses:   Principal Problem:  *Breast cancer metastasized to bone with fatigue, poor oral intake, and failure to thrive status post chemotherapy  Active Problems:   Neutropenic fever, resolved.   Urinary tract infection, resolved.   Pancytopenia due to chemotherapy, resolved.   Transaminitis likely due to liver metastasis, improving.   Hyponatremia, stable.   DM (diabetes mellitus), type 2, uncontrolled   Acute kidney injury   Severe protein-calorie malnutrition   Obesity (BMI 30-39.9)   Mucositis (ulcerative) due to antineoplastic therapy   Epistaxis secondary to thrombocytopenia   Abdominal pain   Pain, abdominal, RUQ   Generalized weakness   Anxiety   Nausea & vomiting   Anorexia   Acute blood loss anemia   Hypotension  Discharge Condition: Improved.  Diet recommendation: Carbohydrate modified.  History of present illness:  Mrs. 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 Patient was admitted 03/06/2012 with complaints of right upper quadrant abdominal pain and poor oral intake.    Hospital Course by problem:  Principal Problem:  *Breast cancer metastasized to bone with fatigue, poor oral intake, and failure to thrive status post chemotherapy  Diagnosed 02/14/2012. First chemotherapy with Taxotere and carboplatinum given 02/29/2012 with subsequent dose given 03/05/2012. Also on Herceptin.  Herceptin given on 03/18/12, given weekly. Oncology following.  PCT has met with patient again 03/19/12, patient now DNR/DNI.    Dietitian, physical therapy, and occupational therapy evaluations performed. SNF recommended. Active Problems:  Hypotension   BP low, but mentating well.   Continue IVF.   No evidence of recurrent GIB. Acute blood loss anemia  Patient passed large blood clot per rectum 03/15/2012.  Transfused 1 unit PRBC 03/15/2012 with appropriate rise in hemoglobin.  Hemoglobin dropped to 7.6 03/19/12, given 2 additional units of PRBCs.  Post transfusion hemoglobin 10.2. Abdominal pain  Unclear etiology, but likely from liver metastasis. Urine culture negative for UTI.  CT abdomen and pelvis done 03/10/12, no acute findings.  Diarrhea improving.  Continue pain control with fentanyl patch, OxyIR, and morphine as needed. Epistaxis secondary to thrombocytopenia  Off Lovenox as of 03/10/12.  SCDs for DVT prophylaxis.  Nasal saline PRN.  No further episodes. Mucositis (ulcerative) due to antineoplastic therapy  Continue Carafate and Magic Mouthwash Q 4 hours. Neutropenic fever  Status post Neulasta support as an outpatient.  Unclear etiology of fever, but put on empiric Cipro for suspected urinary tract infection, which was discontinued on 03/08/12 after urine culture proved negative.  Blood and urine cultures sent, negative to date.  Stool for Clostridium difficile sent, negative.  Empiric antibiotics continued through 03/08/12, discontinued with negative cultures, no return of fever. Urinary tract infection  Urinalysis reviewed and was positive for nitrites, 3-6 white blood cells, and a few bacteria.  Urine cultures sent, negative.  Initially on empiric Cipro which was stopped 03/08/12. Pancytopenia due to chemotherapy  White blood cell count normalized.  Platelets and hemoglobin stable.  Continue to monitor closely. Transaminitis likely due to liver metastasis  Liver ultrasound consistent with metastatic disease.  No evidence of common bile duct obstruction. No indication for stenting at this time.  LFTs trending down. Hyponatremia  May be related to dehydration versus SIADH in the setting of known malignancy.  Normalized with IVF. DM (diabetes mellitus), type 2, uncontrolled with complications  Hemoglobin A1c indicative of poorly controlled diabetes.  Placed on moderate scale sliding scale insulin 03/07/12. CBGs 141-187 over past 24 hours.  Seen by Diabetes coordinator on 03/07/12.  Continue carbohydrate modified diet.  Acute kidney injury  Baseline creatinine 0.63 on 02/20/2012. Current creatinine elevated over her usual baseline values, and likely due to a prerenal etiology.  Continue to hydrate and monitor creatinine, which is slowly improving.  Severe Protein calorie malnutrition / Obesity BMI 35.2  Poor protein stores in the setting of decreased oral intake secondary to cancer.  Dietitian consultation performed 03/07/12.  Procedures:  None.  Consultants:  Dr. Drue Second, Oncology  Dr. Derenda Mis, Palliative Care Other consultants:  Diabetes coordinator  Dietitian  Physical therapy:SNF recommended.  Discharge Exam: Filed Vitals:   03/20/12 0620  BP: 84/40  Pulse: 92  Temp: 98.3 F (36.8 C)  Resp: 16   Filed Vitals:   03/20/12 0142 03/20/12 0245 03/20/12 0302 03/20/12 0620  BP: 89/57 94/60 97/64  84/40  Pulse: 92 94 91 92  Temp: 98.5 F (36.9 C) 98.5 F (36.9 C) 98.2 F (36.8 C) 98.3 F (36.8 C)  TempSrc: Oral Oral Oral Oral  Resp: 16 16 18 16   Height:      Weight:      SpO2:    99%   Gen:  NAD Cardiovascular:  RRR, No M/R/G Respiratory: Lungs CTAB Gastrointestinal: Abdomen soft, NT/ND with normal active bowel sounds. Extremities: No C/E/C    Discharge Instructions  Discharge Orders    Future Appointments: Provider: Department: Dept Phone: Center:   03/20/2012 11:45 AM [facility] [phone] None   03/25/2012 10:30 AM [facility] [phone] None   03/26/2012 12:30 PM Beverely Pace Shumate [facility] [phone] None   03/26/2012 1:00 PM Victorino December, MD [facility] [phone] None   03/26/2012 2:45 PM [facility] [phone] None   04/02/2012 1:30 PM Krista Blue [facility] [phone] None   04/02/2012 2:00 PM Victorino December, MD [facility] [phone] None   04/02/2012 3:00 PM [facility] H29 [facility] [phone] None   04/02/2012 3:15 PM Anabel Bene, RD [facility] [phone] None   04/09/2012 9:00 AM Windell Hummingbird [facility] [phone] None   04/09/2012 9:30 AM Victorino December, MD [facility] [phone] None   04/09/2012 10:30 AM [facility] B6 [facility] [phone] None   04/10/2012 11:45 AM [facility] [phone] None   04/16/2012 9:00 AM Beverely Pace Shumate [facility] [phone] None   04/16/2012 9:30 AM Victorino December, MD [facility] [phone] None   04/16/2012 10:00 AM [facility] A2 [facility] [phone] None   04/23/2012 11:30 AM Krista Blue [facility] [phone] None   04/23/2012 12:00 PM Victorino December, MD [facility] [phone] None   04/23/2012 1:45 PM [facility] A3 [facility] [phone] None   04/30/2012 9:00 AM Radene Gunning [facility] [phone] None   04/30/2012 9:30 AM Victorino December, MD [facility] [phone] None   04/30/2012 10:30 AM [facility] D12 [facility] [phone] None   05/01/2012 11:45 AM [facility] [phone] None   05/07/2012 1:00 PM [facility] A3 [facility] [phone] None   05/14/2012 1:00 PM [facility] A2 [facility] [phone] None  05/21/2012 9:00 AM Windell Hummingbird [facility] [phone] None   05/21/2012 9:30 AM Victorino December, MD [facility] [phone] None   05/21/2012 10:30 AM [facility] B5 [facility] [phone] None   05/22/2012 11:45 AM [facility] [phone] None   05/28/2012 12:45 PM [facility] A2 [facility] [phone] None   06/04/2012 1:30  PM [facility] A1 [facility] [phone] None   06/11/2012 8:30 AM Radene Gunning [facility] [phone] None   06/11/2012 9:00 AM Victorino December, MD [facility] [phone] None   06/11/2012 10:00 AM [facility] B6 [facility] [phone] None   06/12/2012 11:45 AM [facility] [phone] None     Future Orders Please Complete By Expires   Diet Carb Modified      Increase activity slowly      Walk with assistance      Walker       Call MD for:  temperature >100.4      Call MD for:  persistant nausea and vomiting      Call MD for:  severe uncontrolled pain        Medication List  As of 03/20/2012 11:06 AM   STOP taking these medications         ciprofloxacin 500 MG tablet      LORazepam 0.5 MG tablet         TAKE these medications         dexamethasone 4 MG tablet   Commonly known as: DECADRON   Take 2 tablets two times a day the day before Taxotere. Then take 2 tabs two times a day starting the day after chemo for 3 days.      diazepam 5 MG tablet   Commonly known as: VALIUM   Take 1 tablet (5 mg total) by mouth every 6 (six) hours as needed for anxiety.      DSS 100 MG Caps   Take 100 mg by mouth 2 (two) times daily as needed.      fentaNYL 50 MCG/HR   Commonly known as: DURAGESIC - dosed mcg/hr   Place 1 100 mcg and 1 12.5 mcg patch onto skin every 72 hours.  Disp 5 of each type of patch.      insulin aspart 100 UNIT/ML injection   Commonly known as: novoLOG   Inject 0-15 Units into the skin 3 (three) times daily with meals.      insulin aspart 100 UNIT/ML injection   Commonly known as: novoLOG   Inject 0-5 Units into the skin at bedtime.      lidocaine-prilocaine cream   Commonly known as: EMLA   Apply topically as needed.      ondansetron 8 MG tablet   Commonly known as: ZOFRAN   Take 1 tablet two times a day starting the day after chemo for 3 days. Then take 1 tab two times a day as needed for nausea or vomiting.       oxyCODONE 5 MG immediate release tablet   Commonly known as: Oxy IR/ROXICODONE   Take 1-2 tablets (5-10 mg total) by mouth every 4 (four) hours as needed (please evaluate for oversedation ).      polyethylene glycol packet   Commonly known as: MIRALAX / GLYCOLAX   Take 17 g by mouth daily.      PRESCRIPTION MEDICATION   [facility]. Pt's last treatment was on 03-05-12 followed by Dr Eustace Pen.  prochlorperazine 10 MG tablet   Commonly known as: COMPAZINE   Take 1 tablet (10 mg total) by mouth every 6 (six) hours as needed (Nausea or vomiting).      prochlorperazine 25 MG suppository   Commonly known as: COMPAZINE   Place 1 suppository (25 mg total) rectally every 12 (twelve) hours as needed for nausea.      ranitidine 150 MG tablet   Commonly known as: ZANTAC   Take 150 mg by mouth 2 (two) times daily.      sucralfate 1 GM/10ML suspension   Commonly known as: CARAFATE   Take 10 mLs (1 g total) by mouth 4 (four) times daily.           Follow-up Information    Follow up with Drue Second, MD. Schedule an appointment as soon as possible for a visit in 1 week.   Contact information:   [address] [place] [address] [phone]           The results of significant diagnostics from this hospitalization (including imaging, microbiology, ancillary and laboratory) are listed below for reference.    Significant Diagnostic Studies: Dg Chest 2 View 03/06/2012 IMPRESSION: 1. Left IJ Port-A-Cath is in satisfactory and stable position. 2. Low lung volumes. 3. No acute cardiopulmonary disease. Original Report Authenticated By: Jamesetta Orleans. MATTERN, M.D.  US Abdomen Complete 03/06/2012 IMPRESSION: Heterogeneous liver echogenicity with nodular contour, most in keeping with known metastatic disease. Original Report Authenticated By: Waneta Martins, M.D.  Ct Abdomen Pelvis W Contrast 03/10/2012 IMPRESSION: 1. Innumerable liver metastases as noted previously.  2. Widespread osseous metastatic disease as noted previously. 3. Small amount of ascites, new since the prior examinations from May, 2013. 4. Lack of excretion of the intravenous contrast into the collecting systems of the kidneys raises the question of renal toxicity of the chemotherapeutic agent. Clinical correlation. 5. Anasarca. 6. Liquid stool throughout the colon without evidence of colonic wall thickening to confirm colitis. 7. 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 Original Report Authenticated By: Arnell Sieving, M.D.   Microbiology: No results found for this or any previous visit (from the past 240 hour(s)).   Labs: Basic Metabolic Panel:  Lab 03/20/12 8295 03/19/12 1135 03/18/12 0523 03/17/12 0805 03/16/12 0500  NA 134* 137 136 135 136  K 4.2 4.3 4.0 3.9 3.8  CL 108 111 109 107 107  CO2 16* 17* 18* 19 19  GLUCOSE 220* 178* 241* 176* 186*  BUN 37* 37* 35* 35* 33*  CREATININE 1.59* 1.56* 1.28* 1.18* 1.09  CALCIUM 8.2* 7.7* 8.6 8.4 8.3*  MG -- -- -- -- --  PHOS -- -- -- -- --   Liver Function Tests:  Lab 03/20/12 0935 03/15/12 0345 03/14/12 1551  AST 198* 194* 202*  ALT 59* 63* 65*  ALKPHOS 327* 395* 422*  BILITOT 8.8* 10.3* 10.1*  PROT 4.6* 4.6* 4.7*  ALBUMIN 1.4* 1.4* 1.4*   CBC:  Lab 03/20/12 0935 03/19/12 1135 03/18/12 0523 03/17/12 0805 03/16/12 0500  WBC 9.1 8.9 10.6* 10.7* 11.7*  NEUTROABS -- -- -- -- --  HGB 10.2* 7.6* 8.9* 9.2* 9.4*  HCT 30.7* 22.7* 27.2* 27.3* 28.3*  MCV 88.7 90.4 90.7 88.3 88.4  PLT 99* 103* 92* 65* 52*   CBG:  Lab 03/20/12 0741 03/19/12 2240 03/19/12 1724 03/19/12 1204 03/19/12 0812  GLUCAP 187* 162* 164* 184* 141*    Time  coordinating discharge: 45 minutes  Signed:  RAMA,CHRISTINA  [facility] 03/20/2012, 11:06 AM

## 2012-03-20 NOTE — Progress Notes (Signed)
CSW assisting with d/c planning. Pt d/c to [facility] [place] via [facility] this afternoon. Daughter, [name], aware of d/c plans. Admissions Coordinator Lawanna Kobus ) is aware pt has medicaid only and pt does not have  Medicaid prior approval.  Lawanna Kobus indicated that this was not needed. LOG was not requested.   Cori Razor LCSW [phone]

## 2012-03-20 NOTE — Progress Notes (Signed)
Subjective: Clinically patient seems to be doing well. i think she less scleral icterus. This is a good sign. Her white count has recovered and platelets are slowly coming up but hemoglobin still low. It may take time. She has no evidence of hemolysis. Patient is hypotensive but seems to be asymptomatic.  Objective:  Most recent Vital signs: Blood pressure 84/40, pulse 92, temperature 98.3 F (36.8 C), temperature source Oral, resp. rate 16, height 5\' 4"  (1.626 m), weight 206 lb 9.1 oz (93.7 kg), last menstrual period 03/02/2012, SpO2 99.00%. 5\' 4"  (1.626 m)    Body surface area is 2.06 meters squared.  Vital signs in last 24 hours: Temp:  [98 F (36.7 C)-98.5 F (36.9 C)] 98.3 F (36.8 C) (07/17 0620) Pulse Rate:  [88-96] 92  (07/17 0620) Resp:  [14-20] 16  (07/17 0620) BP: (75-97)/(40-64) 84/40 mmHg (07/17 0620) SpO2:  [97 %-99 %] 99 % (07/17 0620)  Intake/Output from previous day: 07/16 0701 - 07/17 0700 In: 3783.2 [P.O.:780; I.V.:2284; Blood:719.2] Out: -   Physical Exam: General appearance: alert, cooperative, appears older than stated age and icteric Eyes: conjunctivae/corneas clear. PERRL, EOM's intact. Fundi benign. Resp: clear to auscultation bilaterally Cardio: regular rate and rhythm, S1, S2 normal, no murmur, click, rub or gallop GI: soft, non-tender; bowel sounds normal; no masses,  no organomegaly Extremities: extremities normal, atraumatic, no cyanosis or edema  Lab Results:   Basename 03/19/12 1135 03/18/12 0523  WBC 8.9 10.6*  HGB 7.6* 8.9*  HCT 22.7* 27.2*  PLT 103* 92*   BMET:  Basename 03/19/12 1135 03/18/12 0523  NA 137 136  K 4.3 4.0  CL 111 109  CO2 17* 18*  GLUCOSE 178* 241*  BUN 37* 35*  CREATININE 1.56* 1.28*  CALCIUM 7.7* 8.6   CMP:     Component Value Date/Time   NA 137 03/19/2012 1135   K 4.3 03/19/2012 1135   CL 111 03/19/2012 1135   CO2 17* 03/19/2012 1135   GLUCOSE 178* 03/19/2012 1135   BUN 37* 03/19/2012 1135   CREATININE  1.56* 03/19/2012 1135   CALCIUM 7.7* 03/19/2012 1135   PROT 4.6* 03/15/2012 0345   ALBUMIN 1.4* 03/15/2012 0345   AST 194* 03/15/2012 0345   ALT 63* 03/15/2012 0345   ALKPHOS 395* 03/15/2012 0345   BILITOT 10.3* 03/15/2012 0345   GFRNONAA 39* 03/19/2012 1135   GFRAA 45* 03/19/2012 1135   Micro: Results for orders placed during the hospital encounter of 03/06/12  URINE CULTURE     Status: Normal   Collection Time   03/06/12  6:44 PM      Component Value Range Status Comment   Specimen Description URINE, CATHETERIZED   Final    Special Requests NONE   Final    Culture  Setup Time 03/07/2012 02:02   Final    Colony Count NO GROWTH   Final    Culture NO GROWTH   Final    Report Status 03/08/2012 FINAL   Final   CULTURE, BLOOD (ROUTINE X 2)     Status: Normal   Collection Time   03/06/12  8:00 PM      Component Value Range Status Comment   Specimen Description BLOOD LEFT WRIST   Final    Special Requests BOTTLES DRAWN AEROBIC AND ANAEROBIC 5CC   Final    Culture  Setup Time 03/07/2012 02:11   Final    Culture NO GROWTH 5 DAYS   Final    Report Status 03/13/2012 FINAL  Final   CULTURE, BLOOD (ROUTINE X 2)     Status: Normal   Collection Time   03/06/12  8:15 PM      Component Value Range Status Comment   Specimen Description BLOOD RIGHT ARM   Final    Special Requests BOTTLES DRAWN AEROBIC AND ANAEROBIC 3CC   Final    Culture  Setup Time 03/07/2012 02:11   Final    Culture NO GROWTH 5 DAYS   Final    Report Status 03/13/2012 FINAL   Final   CLOSTRIDIUM DIFFICILE BY PCR     Status: Normal   Collection Time   03/07/12  6:47 AM      Component Value Range Status Comment   C difficile by pcr NEGATIVE  NEGATIVE Final    Studies/Results: No results found.  Medications: I have reviewed the patient's current medications.  Assessment/Plan: 46 y.o. female with  1. Breast cancer: patient received Herceptin on Monday. Her next chemotherapy will be due next week. She already has an appointment set  up with me for 03/26/12. We will get a printout of her schedule for her.  2. Anemia: s/p 2 units PRBC I have ordered a cbc this morning to see what the final H/H is after transfusion  3. Hypotension: patient seems be constantly running low BP unclear etiology. She however seems to be asymptomatic. I would suggest continuing to monitor and possibly getting cardiology to see her eventually. This certainly can be done as an outpatient.  4. If cleared by the the primary caring physician patient can be transferred to SNF today.       LOS: 14 days   Drue Second, MD Medical/Oncology [facility] [phone] (beeper) [phone] (Office)  03/20/2012, 8:21 AM

## 2012-03-20 NOTE — Progress Notes (Signed)
Pt discharged to [facility] in [place].  Reported called to [place], [facility] fundraiser.  Pt traveled via EMS.  Port-a-cath de-accessed.  There were no further questions at this time.  Pt's daughter, [name], was notified of move.

## 2012-03-21 ENCOUNTER — Other Ambulatory Visit: Payer: Self-pay | Admitting: Certified Registered Nurse Anesthetist

## 2012-03-21 LAB — TYPE AND SCREEN
ABO/RH(D): A POS
Unit division: 0

## 2012-03-25 ENCOUNTER — Telehealth: Payer: Self-pay | Admitting: Medical Oncology

## 2012-03-25 ENCOUNTER — Ambulatory Visit (HOSPITAL_COMMUNITY): Payer: Medicaid Other

## 2012-03-25 NOTE — Telephone Encounter (Signed)
Spoke with patient's daughter, "My mom called me this morning sobbing, saying that she has been throwing up non-stop and she was so miserable. I called the nursing facility that she is currently at and they said they were giving her her medication.  I think she was suppose to see yall this morning but I am unavailable to be there."    Informed daughter that patient's appointment was tomorrow @ 1230 with lab and Dr. Welton Flakes.  Spoke with Dr. Welton Flakes regarding patient's current situation and informed daughter that medical staff at the [facility] would be the ones to address patient's nausea and vomiting.  Daughter expressed understanding and was going to contact the facility again.  No further questions at this time.

## 2012-03-26 ENCOUNTER — Telehealth: Payer: Self-pay | Admitting: Medical Oncology

## 2012-03-26 ENCOUNTER — Telehealth: Payer: Self-pay | Admitting: Oncology

## 2012-03-26 ENCOUNTER — Ambulatory Visit (HOSPITAL_BASED_OUTPATIENT_CLINIC_OR_DEPARTMENT_OTHER): Payer: Medicaid Other

## 2012-03-26 ENCOUNTER — Other Ambulatory Visit: Payer: Self-pay | Admitting: Medical Oncology

## 2012-03-26 ENCOUNTER — Other Ambulatory Visit (HOSPITAL_BASED_OUTPATIENT_CLINIC_OR_DEPARTMENT_OTHER): Payer: Medicaid Other

## 2012-03-26 ENCOUNTER — Ambulatory Visit (HOSPITAL_BASED_OUTPATIENT_CLINIC_OR_DEPARTMENT_OTHER): Payer: Medicaid Other | Admitting: Oncology

## 2012-03-26 ENCOUNTER — Encounter: Payer: Self-pay | Admitting: Oncology

## 2012-03-26 VITALS — BP 100/66 | HR 88 | Temp 97.2°F

## 2012-03-26 VITALS — BP 97/61 | HR 88 | Temp 97.8°F

## 2012-03-26 DIAGNOSIS — C50919 Malignant neoplasm of unspecified site of unspecified female breast: Secondary | ICD-10-CM

## 2012-03-26 DIAGNOSIS — IMO0001 Reserved for inherently not codable concepts without codable children: Secondary | ICD-10-CM

## 2012-03-26 DIAGNOSIS — C787 Secondary malignant neoplasm of liver and intrahepatic bile duct: Secondary | ICD-10-CM

## 2012-03-26 DIAGNOSIS — C7952 Secondary malignant neoplasm of bone marrow: Secondary | ICD-10-CM

## 2012-03-26 DIAGNOSIS — R112 Nausea with vomiting, unspecified: Secondary | ICD-10-CM

## 2012-03-26 DIAGNOSIS — E860 Dehydration: Secondary | ICD-10-CM

## 2012-03-26 DIAGNOSIS — Z5112 Encounter for antineoplastic immunotherapy: Secondary | ICD-10-CM

## 2012-03-26 LAB — COMPREHENSIVE METABOLIC PANEL
Albumin: 1.7 g/dL — ABNORMAL LOW (ref 3.5–5.2)
BUN: 30 mg/dL — ABNORMAL HIGH (ref 6–23)
Calcium: 8.2 mg/dL — ABNORMAL LOW (ref 8.4–10.5)
Chloride: 107 mEq/L (ref 96–112)
Creatinine, Ser: 1.21 mg/dL — ABNORMAL HIGH (ref 0.50–1.10)
Glucose, Bld: 159 mg/dL — ABNORMAL HIGH (ref 70–99)
Potassium: 3.5 mEq/L (ref 3.5–5.3)

## 2012-03-26 LAB — CBC WITH DIFFERENTIAL/PLATELET
Basophils Absolute: 0 10*3/uL (ref 0.0–0.1)
EOS%: 1 % (ref 0.0–7.0)
HCT: 29.8 % — ABNORMAL LOW (ref 34.8–46.6)
HGB: 10 g/dL — ABNORMAL LOW (ref 11.6–15.9)
LYMPH%: 14.4 % (ref 14.0–49.7)
MCH: 29.6 pg (ref 25.1–34.0)
MONO#: 0.5 10*3/uL (ref 0.1–0.9)
NEUT%: 77.4 % — ABNORMAL HIGH (ref 38.4–76.8)
Platelets: 83 10*3/uL — ABNORMAL LOW (ref 145–400)
lymph#: 1.1 10*3/uL (ref 0.9–3.3)

## 2012-03-26 LAB — TECHNOLOGIST REVIEW

## 2012-03-26 MED ORDER — TRASTUZUMAB CHEMO INJECTION 440 MG
2.0000 mg/kg | Freq: Once | INTRAVENOUS | Status: AC
  Administered 2012-03-26: 189 mg via INTRAVENOUS
  Filled 2012-03-26: qty 9

## 2012-03-26 MED ORDER — HEPARIN SOD (PORK) LOCK FLUSH 100 UNIT/ML IV SOLN
500.0000 [IU] | Freq: Once | INTRAVENOUS | Status: AC | PRN
  Administered 2012-03-26: 500 [IU]
  Filled 2012-03-26: qty 5

## 2012-03-26 MED ORDER — SODIUM CHLORIDE 0.9 % IV SOLN
Freq: Once | INTRAVENOUS | Status: AC
  Administered 2012-03-26: 13:00:00 via INTRAVENOUS

## 2012-03-26 MED ORDER — ACETAMINOPHEN 325 MG PO TABS
650.0000 mg | ORAL_TABLET | Freq: Once | ORAL | Status: AC
  Administered 2012-03-26: 650 mg via ORAL

## 2012-03-26 MED ORDER — SODIUM CHLORIDE 0.9 % IV SOLN
INTRAVENOUS | Status: AC
  Administered 2012-03-26: 16:00:00 via INTRAVENOUS

## 2012-03-26 MED ORDER — ONDANSETRON 16 MG/50ML IVPB (CHCC)
16.0000 mg | Freq: Once | INTRAVENOUS | Status: AC
  Administered 2012-03-26: 16 mg via INTRAVENOUS

## 2012-03-26 MED ORDER — DIPHENHYDRAMINE HCL 50 MG/ML IJ SOLN
25.0000 mg | Freq: Once | INTRAMUSCULAR | Status: AC
  Administered 2012-03-26: 25 mg via INTRAVENOUS

## 2012-03-26 MED ORDER — SODIUM CHLORIDE 0.9 % IJ SOLN
10.0000 mL | INTRAMUSCULAR | Status: DC | PRN
  Administered 2012-03-26: 10 mL
  Filled 2012-03-26: qty 10

## 2012-03-26 MED ORDER — DIPHENHYDRAMINE HCL 25 MG PO CAPS: 50.0000 mg | ORAL_CAPSULE | Freq: Once | ORAL | Status: DC

## 2012-03-26 NOTE — Progress Notes (Signed)
OFFICE PROGRESS NOTE  CC Dr. Emelia Loron  DIAGNOSIS: 46 year old female with New diagnosis of widely metastatic Breast cancer diagnosed 02/14/2012 by a needle core biopsy of the right breast. Her needle core biopsy was performed at [facility] and the pathology was reviewed by Dr. Delena Bali. Her was ER-positive 5% PR negative HER-2/neu overexpressed Ki-67 50%  PRIOR THERAPY:  #1 patient originally presented to my clinic on 01/23/2012 with a right breast mass and flank pain. At that time I ordered staging studies and ordered mammogram as well as an ultrasound.The ultrasound showed a irregular hypoechoic mass in the 9:00 position 11 cm from the nipple measuring 2.9 x 2.8 x 3.3 cm. Another irregular hypo-: Mass was noted in the 9:00 position 9 cm from the nipple measuring 0.9 x 1.0 x 0.9 cm skin thickening was noted.And abnormal node was imaged in the right axilla with loss of fatty hilum measuring 1.9 x 0.2 x 1.1 cm. Left breast was negative for a malignancy. Performed on the same day revealed in the right breast posteriorly in the upper outer quadrant irregular spiculated mass 1 cm anterior in the upper outer quadrant area measured 4.8 x 6.7 cm in conglomeration with skin thickening.  #2 PET CT revealed 2 hypermetabolic masses in the posterior right breast consistent with primary breast carcinoma hypermetabolic subcutaneous tissue on the right possibly representing inflammatory carcinoma. There was a local right axillary nodal metastasis. Also noted were hypermetabolic internal mammary nodal metastasis on the right. Hepatic metastases with large tumor burden within the left and right hepatic lobe the lesions were intensely hypermetabolic with SUV 15.0 there also noted to be periportal nodal metastases. Widespread hypermetabolic skeletal metastases involving the axillary and appendicular skeleton including the skull base. There are ill-defined pulmonary nodules without associated metabolic  activity.[name] #3 patient had a right breast biopsy performed on 02/14/2012. 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 Sanders biopsy at the 9:00 position 11 cm from the nipple all show showed invasive ductal carcinoma moderate to poorly differentiated. The prognostic panel reveals estrogen receptor 5% moderately staining progesterone receptor 0% HER-2/neu strong +3 and overexpressed proliferation marker Ki-67 50% and elevated.  3 patient has been seen by Dr. Emelia Loron in consultation  CURRENT THERAPY: Palliative chemotherapy with [facility], here for Herceptin only today  INTERVAL HISTORY: Vicki Sanders 46 y.o. female returns for follow up visit today.She was recently hospitalized with severe pain dehydration jaundice and pancytopenia. She remained in the hospital for about a week. During her hospitalization she did get her Herceptin. She was discharged about 4 days ago. Patient's daughter called [place] several times over the last 48 hours stating that her mother was not doing well at the skilled nursing facility. She was incoherent not eating much she was dehydrated. Patient already had an appointment with [place] today and so she was brought in early. Patient states that she just feels very tired fatigued she has extreme nausea as well as ongoing vomiting. She does have a rotation 6 and reflux symptoms as well. She otherwise is denying any pain. She has significant grade 3 weakness and fatigue. She has not had headaches blurring of vision no back pain. Remainder of the 10 point review of systems is unremarkable MEDICAL HISTORY: Past Medical History  Diagnosis Date  . Diabetes mellitus   . Arthritis   . Cancer 01/23/2012    ALLERGIES:  is  allergic to cephalosporins.  MEDICATIONS:  Current Outpatient Prescriptions  Medication Sig Dispense Refill  . dexamethasone  (DECADRON) 4 MG tablet Take 2 tablets two times a day the day before Taxotere. Then take 2 tabs two times a day starting the day after chemo for 3 days.  30 tablet  1  . diazepam (VALIUM) 5 MG tablet Take 1 tablet (5 mg total) by mouth every 6 (six) hours as needed for anxiety.  30 tablet  0  . docusate sodium 100 MG CAPS Take 100 mg by mouth 2 (two) times daily as needed.  10 capsule    . fentaNYL (DURAGESIC - DOSED MCG/HR) 50 MCG/HR Place 1 100 mcg and 1 12.5 mcg patch onto skin every 72 hours.  Disp 5 of each type of patch.  5 patch  0  . insulin aspart (NOVOLOG) 100 UNIT/ML injection Inject 0-15 Units into the skin 3 (three) times daily with meals.  1 vial    . insulin aspart (NOVOLOG) 100 UNIT/ML injection Inject 0-5 Units into the skin at bedtime.  1 vial    . lidocaine-prilocaine (EMLA) cream Apply topically as needed.  30 g  6  . ondansetron (ZOFRAN) 8 MG tablet Take 1 tablet two times a day starting the day after chemo for 3 days. Then take 1 tab two times a day as needed for nausea or vomiting.  30 tablet  1  . oxyCODONE (OXY IR/ROXICODONE) 5 MG immediate release tablet Take 1-2 tablets (5-10 mg total) by mouth every 4 (four) hours as needed (please evaluate for oversedation ).  30 tablet  0  . PRESCRIPTION MEDICATION Pt gets chemo at [facility]. Pt's last treatment was on 03-05-12 followed by Dr Eustace Pen.      . prochlorperazine (COMPAZINE) 10 MG tablet Take 1 tablet (10 mg total) by mouth every 6 (six) hours as needed (Nausea or vomiting).  30 tablet  1  . prochlorperazine (COMPAZINE) 25 MG suppository Place 1 suppository (25 mg total) rectally every 12 (twelve) hours as needed for nausea.  12 suppository  3  . ranitidine (ZANTAC) 150 MG tablet Take 150 mg by mouth 2 (two) times daily.      . sucralfate (CARAFATE) 1 GM/10ML suspension Take 10 mLs (1 g total) by mouth 4 (four) times daily.  100 mL  0   Current Facility-Administered Medications  Medication Dose Route Frequency Provider Last Rate Last  Dose  . 0.9 %  sodium chloride infusion   Intravenous Once Victorino December, MD 500 mL/hr at 03/26/12 1305    . ondansetron (ZOFRAN) IVPB 16 mg  16 mg Intravenous Once Victorino December, MD   16 mg at 03/26/12 1305    SURGICAL HISTORY:  Past Surgical History  Procedure Date  . Shoulder arthrotomy   . Tonsillectomy   . Appendectomy   . Cesarean section     REVIEW OF SYSTEMS:  Pertinent items are noted in HPI.   PHYSICAL EXAMINATION: General appearance: alert, cooperative, appears older than stated age, fatigued, flushed, mild distress, mildly obese and pale Neck: no adenopathy, no carotid bruit, no JVD, supple, symmetrical, trachea midline and thyroid not enlarged, symmetric, no tenderness/mass/nodules Lymph nodes: Cervical, supraclavicular, and axillary nodes normal. Resp: clear to auscultation bilaterally and normal percussion bilaterally Back: he does have some tenderness diffusely Cardio: regular rate and rhythm, S1, S2 normal, no murmur, click, rub or gallop GI: abdomen is soft liver is palpable 2 fingerbreadths below the right costal margin no splenomegaly no other  palpable masses bowel sounds are present Extremities: extremities normal, atraumatic, no cyanosis or edema Neurologic: Grossly normal Bilateral breast examination right breast reveals a palpable mass measuring about 5 cm it is tender to palpation no nipple discharge there are some thickening of the skin and skin changes. Left breast no masses nipple discharge or skin changes. ECOG PERFORMANCE STATUS: 1 - Symptomatic but completely ambulatory  Blood pressure 97/61, pulse 88, temperature 97.8 F (36.6 C), temperature source Oral, last menstrual period 03/02/2012.  LABORATORY DATA: Lab Results  Component Value Date   WBC 9.1 03/20/2012   HGB 10.2* 03/20/2012   HCT 30.7* 03/20/2012   MCV 88.7 03/20/2012   PLT 99* 03/20/2012      Chemistry      Component Value Date/Time   NA 134* 03/20/2012 0935   K 4.2 03/20/2012 0935    CL 108 03/20/2012 0935   CO2 16* 03/20/2012 0935   BUN 37* 03/20/2012 0935   CREATININE 1.59* 03/20/2012 0935      Component Value Date/Time   CALCIUM 8.2* 03/20/2012 0935   ALKPHOS 327* 03/20/2012 0935   AST 198* 03/20/2012 0935   ALT 59* 03/20/2012 0935   BILITOT 8.8* 03/20/2012 0935       RADIOGRAPHIC STUDIES:  ASSESSMENT: 46 year old female with  #1 new diagnosis of 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 Patient is status post needle core biopsy of the right breast mass. The final pathology revealed a invasive ductal carcinoma high-grade with LDI tumor ER +5% PR -0% HER-2/neu overexpressed 3+ Ki-67 50%.  #2 patient's staging studies reveal widely metastatic disease including liver and bone metastases and nodal metastasis.  #3 recommendation is Herceptin based systemic chemotherapy. I have recommended Taxotere carboplatinum and Herceptin. The Taxotere carboplatinum will be given every 3 weeks with Herceptin given weekly. The Taxotere carboplatinum will be given for a total of 6 cycles. She will receive day 2 Neulasta.  #4 after patient has had at least 3-4 cycles of chemotherapy we will do restaging studies including a PET/CT's.  #5 patient will eventually require ongoing therapy with Herceptin and antiestrogen likely consisting of tamoxifen since patient is premenopausal.  #6 for bony metastasis we will start XGeva every 28 days.  #7 dehydration nausea vomiting: Patient will continue to get IV fluids as well as antiemetics.   PLAN:   #1 patient was scheduled for Taxotere or and carboplatinum today but we will postpone this by at least 2 weeks. However she will receive Herceptin today.Her total bilirubin is slowly coming down it is 8.3 today. Remainder of the LFTs are slowly improving.  #2 patient will continue to receive IV fluids as well as antiemetics throughout this  week. We will have nutrition meet with the patient to see what we can do in terms of her intake and build up her nutrition.  #3 patient after she completes her IV fluids will be discharged to skilled nursing facility. She'll be seen back tomorrow for IV fluids.  #4 patient's prognosis remains very guarded at this point.   All questions were answered. The patient knows to call the clinic with any problems, questions or concerns. We can certainly see the patient much sooner if necessary.  I spent 25 minutes counseling the patient face to face. The total time spent in the appointment was 30 minutes.    Vicki Second, MD Medical/Oncology [facility] [phone] (beeper) [phone] (Office)  03/26/2012, 1:16 PM

## 2012-03-26 NOTE — Telephone Encounter (Signed)
Received call from patient's daughter with concerns for her mother.  Stated "I went to see mom yesterday and she just looks like she is ready to go, she is not doing well at all.  She is uncomfortable, her right breast is noticeably swollen, she is throwing up several times a day, she is not eating, she moans all the time, and to me she just seems really, really lethargic and weak.  I talked to the nurse that was taking care of her last night and she just kinda confirmed what I was saying."  Advised daughter, [name], that I would speak to the [facility] to obtain information as to how her mother is this morning, reminded daughter of patient's appointment this afternoon with Dr. Welton Flakes who would be able to make an assessment as to the needs of the patient.  Listened to daughter's concerns and frustrations, informed daughter that after talking with Dr. Welton Flakes and the [facility] that I would return a call to her and let her know what would be happening.  Spoke with Amy, nurse at [facility], regarding patient's status, advised Amy of daughter's concerns.   "This is my first time taking care of patient but I feel like patient is more depressed than lethargic.  I mean she is not at the point where she can't hold her eyes open or we are having a hard time getting her awake.  She responds appropriately and has been sitting on the side of the bed each time I have seen her.  Patient is not complaining of any pain at the present, she is nauseated and has a decreased appetite.  I feel like she is probably more lethargic and nauseated after her chemo treatments, but she is able to sip on water and ginger ale and keep that down.  She does have occasional dry heaves.  I don't feel as though at this current time there is anything that warrants moving patient to the emergency room."  Confirmed that center was aware of patient's appointments today with Dr. Welton Flakes.  "We have her up and ready to go."`  Spoke with [name], patient's  daughter, and informed her that per nursing staff at the [facility] her mom was doing better this morning, sitting on the side of the bed, was able to go to the bathroom with a little bit of help but was able to take care of her own personal hygiene without any assistance.  Patient denied any pain, still c/o nausea but was able to drink some water and ginger ale.  Daughter expressed understanding, no further questions at this time.   Advised Dr. Welton Flakes of current situation and concerns from daughter.

## 2012-03-26 NOTE — Progress Notes (Signed)
@   1620-Notified Dr. Welton Flakes of BP reading. Order to bolus with another 500 cc saline. Patient reports her nausea has resolved at this time.

## 2012-03-26 NOTE — Telephone Encounter (Signed)
Attempted to all the pt at home but her phone number was not working at the time. Will try again the next day

## 2012-03-27 ENCOUNTER — Other Ambulatory Visit: Payer: Self-pay | Admitting: Pharmacist

## 2012-03-27 ENCOUNTER — Ambulatory Visit (HOSPITAL_BASED_OUTPATIENT_CLINIC_OR_DEPARTMENT_OTHER): Payer: Medicaid Other

## 2012-03-27 ENCOUNTER — Other Ambulatory Visit: Payer: Self-pay | Admitting: Medical Oncology

## 2012-03-27 VITALS — BP 103/65 | HR 87 | Temp 97.9°F

## 2012-03-27 DIAGNOSIS — E860 Dehydration: Secondary | ICD-10-CM

## 2012-03-27 DIAGNOSIS — C787 Secondary malignant neoplasm of liver and intrahepatic bile duct: Secondary | ICD-10-CM

## 2012-03-27 DIAGNOSIS — C50419 Malignant neoplasm of upper-outer quadrant of unspecified female breast: Secondary | ICD-10-CM

## 2012-03-27 DIAGNOSIS — C50919 Malignant neoplasm of unspecified site of unspecified female breast: Secondary | ICD-10-CM

## 2012-03-27 DIAGNOSIS — R112 Nausea with vomiting, unspecified: Secondary | ICD-10-CM

## 2012-03-27 MED ORDER — HEPARIN SOD (PORK) LOCK FLUSH 100 UNIT/ML IV SOLN
500.0000 [IU] | Freq: Once | INTRAVENOUS | Status: AC
  Administered 2012-03-27: 500 [IU] via INTRAVENOUS
  Filled 2012-03-27: qty 5

## 2012-03-27 MED ORDER — ONDANSETRON 16 MG/50ML IVPB (CHCC)
16.0000 mg | Freq: Once | INTRAVENOUS | Status: AC
  Administered 2012-03-27: 16 mg via INTRAVENOUS

## 2012-03-27 MED ORDER — SODIUM CHLORIDE 0.9 % IV SOLN
Freq: Once | INTRAVENOUS | Status: AC
  Administered 2012-03-27: 16:00:00 via INTRAVENOUS

## 2012-03-27 MED ORDER — SODIUM CHLORIDE 0.9 % IJ SOLN
10.0000 mL | INTRAMUSCULAR | Status: DC | PRN
  Administered 2012-03-27: 10 mL via INTRAVENOUS
  Filled 2012-03-27: qty 10

## 2012-03-27 NOTE — Patient Instructions (Signed)
[facility] Discharge Instructions for Patients Receiving Chemotherapy  Today you received the following  Agents: IV fluids  To help prevent nausea and vomiting after your treatment, we encourage you to take your nausea medication.    If you develop nausea and vomiting that is not controlled by your nausea medication, call the clinic. If it is after clinic hours your family physician or the after hours number for the clinic or go to the Emergency Department.   BELOW ARE SYMPTOMS THAT SHOULD BE REPORTED IMMEDIATELY:  *FEVER GREATER THAN 100.5 F  *CHILLS WITH OR WITHOUT FEVER  NAUSEA AND VOMITING THAT IS NOT CONTROLLED WITH YOUR NAUSEA MEDICATION  *UNUSUAL SHORTNESS OF BREATH  *UNUSUAL BRUISING OR BLEEDING  TENDERNESS IN MOUTH AND THROAT WITH OR WITHOUT PRESENCE OF ULCERS  *URINARY PROBLEMS  *BOWEL PROBLEMS  UNUSUAL RASH Items with * indicate a potential emergency and should be followed up as soon as possible.  Feel free to call the clinic you have any questions or concerns. The clinic phone number is [phone].   I have been informed and understand all the instructions given to me. I know to contact the clinic, my physician, or go to the Emergency Department if any problems should occur. I do not have any questions at this time, but understand that I may call the clinic during office hours   should I have any questions or need assistance in obtaining follow up care.    __________________________________________  _____________  __________ Signature of Patient or Authorized Representative            Date                   Time    __________________________________________ Nurse's Signature

## 2012-03-28 ENCOUNTER — Ambulatory Visit: Payer: Medicaid Other

## 2012-03-29 ENCOUNTER — Ambulatory Visit: Payer: Medicaid Other

## 2012-04-02 ENCOUNTER — Ambulatory Visit: Payer: Medicaid Other | Admitting: Nutrition

## 2012-04-02 ENCOUNTER — Encounter: Payer: Self-pay | Admitting: *Deleted

## 2012-04-02 ENCOUNTER — Ambulatory Visit (HOSPITAL_BASED_OUTPATIENT_CLINIC_OR_DEPARTMENT_OTHER): Payer: Medicaid Other | Admitting: Oncology

## 2012-04-02 ENCOUNTER — Telehealth: Payer: Self-pay | Admitting: *Deleted

## 2012-04-02 ENCOUNTER — Ambulatory Visit (HOSPITAL_BASED_OUTPATIENT_CLINIC_OR_DEPARTMENT_OTHER): Payer: Medicaid Other

## 2012-04-02 ENCOUNTER — Other Ambulatory Visit (HOSPITAL_BASED_OUTPATIENT_CLINIC_OR_DEPARTMENT_OTHER): Payer: Medicaid Other | Admitting: Lab

## 2012-04-02 ENCOUNTER — Ambulatory Visit (HOSPITAL_COMMUNITY)
Admission: RE | Admit: 2012-04-02 | Discharge: 2012-04-02 | Disposition: A | Discharge status: Home / Self Care | Payer: Medicaid Other | Source: Ambulatory Visit | Attending: Oncology | Admitting: Oncology

## 2012-04-02 ENCOUNTER — Encounter: Payer: Self-pay | Admitting: Oncology

## 2012-04-02 VITALS — BP 105/67 | HR 89 | Temp 97.9°F | Ht 64.0 in | Wt 258.6 lb

## 2012-04-02 VITALS — Wt 267.2 lb

## 2012-04-02 DIAGNOSIS — C7951 Secondary malignant neoplasm of bone: Secondary | ICD-10-CM

## 2012-04-02 DIAGNOSIS — C50919 Malignant neoplasm of unspecified site of unspecified female breast: Secondary | ICD-10-CM

## 2012-04-02 DIAGNOSIS — C50419 Malignant neoplasm of upper-outer quadrant of unspecified female breast: Secondary | ICD-10-CM

## 2012-04-02 DIAGNOSIS — C800 Disseminated malignant neoplasm, unspecified: Secondary | ICD-10-CM

## 2012-04-02 DIAGNOSIS — W19XXXA Unspecified fall, initial encounter: Secondary | ICD-10-CM | POA: Insufficient documentation

## 2012-04-02 DIAGNOSIS — C787 Secondary malignant neoplasm of liver and intrahepatic bile duct: Secondary | ICD-10-CM

## 2012-04-02 DIAGNOSIS — Z5112 Encounter for antineoplastic immunotherapy: Secondary | ICD-10-CM

## 2012-04-02 DIAGNOSIS — M25559 Pain in unspecified hip: Secondary | ICD-10-CM | POA: Insufficient documentation

## 2012-04-02 DIAGNOSIS — C7952 Secondary malignant neoplasm of bone marrow: Secondary | ICD-10-CM | POA: Insufficient documentation

## 2012-04-02 LAB — CBC WITH DIFFERENTIAL/PLATELET
EOS%: 0 % (ref 0.0–7.0)
Eosinophils Absolute: 0 10*3/uL (ref 0.0–0.5)
LYMPH%: 11 % — ABNORMAL LOW (ref 14.0–49.7)
MCH: 30.1 pg (ref 25.1–34.0)
MCHC: 33.7 g/dL (ref 31.5–36.0)
MCV: 89.3 fL (ref 79.5–101.0)
MONO%: 3.9 % (ref 0.0–14.0)
NEUT#: 7.1 10*3/uL — ABNORMAL HIGH (ref 1.5–6.5)
Platelets: 70 10*3/uL — ABNORMAL LOW (ref 145–400)
RBC: 3.19 10*6/uL — ABNORMAL LOW (ref 3.70–5.45)
nRBC: 1 % — ABNORMAL HIGH (ref 0–0)

## 2012-04-02 LAB — COMPREHENSIVE METABOLIC PANEL
ALT: 54 U/L — ABNORMAL HIGH (ref 0–35)
CO2: 19 mEq/L (ref 19–32)
Creatinine, Ser: 1.25 mg/dL — ABNORMAL HIGH (ref 0.50–1.10)
Total Bilirubin: 9 mg/dL — ABNORMAL HIGH (ref 0.3–1.2)

## 2012-04-02 MED ORDER — TRASTUZUMAB CHEMO INJECTION 440 MG
2.0000 mg/kg | Freq: Once | INTRAVENOUS | Status: AC
  Administered 2012-04-02: 189 mg via INTRAVENOUS
  Filled 2012-04-02: qty 9

## 2012-04-02 MED ORDER — ACETAMINOPHEN 325 MG PO TABS
650.0000 mg | ORAL_TABLET | Freq: Once | ORAL | Status: AC
  Administered 2012-04-02: 650 mg via ORAL

## 2012-04-02 MED ORDER — HEPARIN SOD (PORK) LOCK FLUSH 100 UNIT/ML IV SOLN
500.0000 [IU] | Freq: Once | INTRAVENOUS | Status: AC | PRN
  Administered 2012-04-02: 500 [IU]
  Filled 2012-04-02: qty 5

## 2012-04-02 MED ORDER — DIPHENHYDRAMINE HCL 25 MG PO CAPS
50.0000 mg | ORAL_CAPSULE | Freq: Once | ORAL | Status: AC
  Administered 2012-04-02: 50 mg via ORAL

## 2012-04-02 MED ORDER — SODIUM CHLORIDE 0.9 % IV SOLN
Freq: Once | INTRAVENOUS | Status: AC
  Administered 2012-04-02: 15:00:00 via INTRAVENOUS

## 2012-04-02 MED ORDER — DENOSUMAB 120 MG/1.7ML ~~LOC~~ SOLN
120.0000 mg | Freq: Once | SUBCUTANEOUS | Status: AC
  Administered 2012-04-02: 120 mg via SUBCUTANEOUS
  Filled 2012-04-02: qty 1.7

## 2012-04-02 MED ORDER — SODIUM CHLORIDE 0.9 % IJ SOLN
10.0000 mL | INTRAMUSCULAR | Status: DC | PRN
  Administered 2012-04-02: 10 mL
  Filled 2012-04-02: qty 10

## 2012-04-02 NOTE — Patient Instructions (Addendum)
[facility] Discharge Instructions for Patients Receiving Chemotherapy  Today you received the following chemotherapy agents Xgeva & Herceptin To help prevent nausea and vomiting after your treatment, we encourage you to take your nausea medication as directed  If you develop nausea and vomiting that is not controlled by your nausea medication, call the clinic. If it is after clinic hours your family physician or the after hours number for the clinic or go to the Emergency Department.   BELOW ARE SYMPTOMS THAT SHOULD BE REPORTED IMMEDIATELY:  *FEVER GREATER THAN 100.5 F  *CHILLS WITH OR WITHOUT FEVER  NAUSEA AND VOMITING THAT IS NOT CONTROLLED WITH YOUR NAUSEA MEDICATION  *UNUSUAL SHORTNESS OF BREATH  *UNUSUAL BRUISING OR BLEEDING  TENDERNESS IN MOUTH AND THROAT WITH OR WITHOUT PRESENCE OF ULCERS  *URINARY PROBLEMS  *BOWEL PROBLEMS  UNUSUAL RASH Items with * indicate a potential emergency and should be followed up as soon as possible.  One of the nurses will contact you 24 hours after your treatment. Please let the nurse know about any problems that you may have experienced. Feel free to call the clinic you have any questions or concerns. The clinic phone number is [phone].   I have been informed and understand all the instructions given to me. I know to contact the clinic, my physician, or go to the Emergency Department if any problems should occur. I do not have any questions at this time, but understand that I may call the clinic during office hours   should I have any questions or need assistance in obtaining follow up care.    __________________________________________  _____________  __________ Signature of Patient or Authorized Representative            Date                   Time    __________________________________________ Nurse's Signature

## 2012-04-02 NOTE — Progress Notes (Signed)
OFFICE PROGRESS NOTE  CC Dr. Emelia Loron  DIAGNOSIS: 46 year old female with New diagnosis of widely metastatic Breast cancer diagnosed 02/14/2012 by a needle core biopsy of the right breast. Her needle core biopsy was performed at [facility] and the pathology was reviewed by Dr. Delena Bali. Her was ER-positive 5% PR negative HER-2/neu overexpressed Ki-67 50%  PRIOR THERAPY:  #1 patient originally presented to my clinic on 01/23/2012 with a right breast mass and flank pain. At that time I ordered staging studies and ordered mammogram as well as an ultrasound.The ultrasound showed a irregular hypoechoic mass in the 9:00 position 11 cm from the nipple measuring 2.9 x 2.8 x 3.3 cm. Another irregular hypo-: Mass was noted in the 9:00 position 9 cm from the nipple measuring 0.9 x 1.0 x 0.9 cm skin thickening was noted.And abnormal node was imaged in the right axilla with loss of fatty hilum measuring 1.9 x 0.2 x 1.1 cm. Left breast was negative for a malignancy. Performed on the same day revealed in the right breast posteriorly in the upper outer quadrant irregular spiculated mass 1 cm anterior in the upper outer quadrant area measured 4.8 x 6.7 cm in conglomeration with skin thickening.  #2 PET CT revealed 2 hypermetabolic masses in the posterior right breast consistent with primary breast carcinoma hypermetabolic subcutaneous tissue on the right possibly representing inflammatory carcinoma. There was a local right axillary nodal metastasis. Also noted were hypermetabolic internal mammary nodal metastasis on the right. Hepatic metastases with large tumor burden within the left and right hepatic lobe the lesions were intensely hypermetabolic with SUV 15.0 there also noted to be periportal nodal metastases. Widespread hypermetabolic skeletal metastases involving the axillary and appendicular skeleton including the skull base. There are ill-defined pulmonary nodules without associated metabolic  activity.[name] #3 patient had a right breast biopsy performed on 02/14/2012. 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 The prognostic panel reveals estrogen receptor 5% moderately staining progesterone receptor 0% HER-2/neu strong +3 and overexpressed proliferation marker Ki-67 50% and elevated.  3 patient has been seen by Dr. Emelia Loron in consultation  CURRENT THERAPY: Palliative chemotherapy with [facility], here for Herceptin only today  INTERVAL HISTORY: Nori Winegar 46 y.o. female returns for follow up visit today. Patient apparently sustained a fall at the skilled nursing facility. She has significant facial bruises especially on the orbits. She also is complaining of bilateral pelvis pain from the fall. She also feels very weak tired and fatigued and she is not very ambulatory do to weakness. She has gained significant amount of weight she has bilateral lower extremity swelling. She still remains icteric. She occasionally does get nausea but she does tell me that she is eating better and is drinking and keeping herself well hydrated. She and her daughter are concerned about her present living situation since patient feels that she is not getting the care and help that she needs at the skilled nursing facility. Her daughter had called [place] a few days ago stating that she may move her mom to [place] to be closer to her. I do think this may be most appropriate. She certainly will need medical oncology care and as soon as they decide where she will be moving I can make a referral. MEDICAL  HISTORY: Past Medical History  Diagnosis Date  . Diabetes mellitus   . Arthritis   . Cancer 01/23/2012    ALLERGIES:  is allergic to  cephalosporins.  MEDICATIONS:  Current Outpatient Prescriptions  Medication Sig Dispense Refill  . dexamethasone (DECADRON) 4 MG tablet Take 2 tablets two times a day the day before Taxotere. Then take 2 tabs two times a day starting the day after chemo for 3 days.  30 tablet  1  . fentaNYL (DURAGESIC - DOSED MCG/HR) 50 MCG/HR Place 1 100 mcg and 1 12.5 mcg patch onto skin every 72 hours.  Disp 5 of each type of patch.  5 patch  0  . insulin aspart (NOVOLOG) 100 UNIT/ML injection Inject 0-15 Units into the skin 3 (three) times daily with meals.  1 vial    . insulin aspart (NOVOLOG) 100 UNIT/ML injection Inject 0-5 Units into the skin at bedtime.  1 vial    . lidocaine-prilocaine (EMLA) cream Apply topically as needed.  30 g  6  . ondansetron (ZOFRAN) 8 MG tablet Take 1 tablet two times a day starting the day after chemo for 3 days. Then take 1 tab two times a day as needed for nausea or vomiting.  30 tablet  1  . PRESCRIPTION MEDICATION [facility]. Pt's last treatment was on 03-05-12 followed by Dr Eustace Pen.      . prochlorperazine (COMPAZINE) 10 MG tablet Take 1 tablet (10 mg total) by mouth every 6 (six) hours as needed (Nausea or vomiting).  30 tablet  1  . prochlorperazine (COMPAZINE) 25 MG suppository Place 1 suppository (25 mg total) rectally every 12 (twelve) hours as needed for nausea.  12 suppository  3  . ranitidine (ZANTAC) 150 MG tablet Take 150 mg by mouth 2 (two) times daily.      . sucralfate (CARAFATE) 1 GM/10ML suspension Take 10 mLs (1 g total) by mouth 4 (four) times daily.  100 mL  0    SURGICAL HISTORY:  Past Surgical History  Procedure Date  . Shoulder arthrotomy   . Tonsillectomy   . Appendectomy   . Cesarean section     REVIEW OF SYSTEMS:  Pertinent items are noted in HPI.   PHYSICAL EXAMINATION: General appearance: alert, cooperative, appears older than stated age, fatigued, flushed, mild distress, mildly obese and pale Neck: no adenopathy, no carotid bruit,  no JVD, supple, symmetrical, trachea midline and thyroid not enlarged, symmetric, no tenderness/mass/nodules Lymph nodes: Cervical, supraclavicular, and axillary nodes normal. Resp: clear to auscultation bilaterally and normal percussion bilaterally Back: he does have some tenderness diffusely Cardio: regular rate and rhythm, S1, S2 normal, no murmur, click, rub or gallop GI: abdomen is soft liver is palpable 2 fingerbreadths below the right costal margin no splenomegaly no other palpable masses bowel sounds are present Extremities: extremities normal, atraumatic, no cyanosis or edema Neurologic: Grossly normal Bilateral breast examination right breast reveals a palpable mass measuring about 5 cm it is tender to palpation no nipple discharge there are some thickening of the skin and skin changes. Left breast no masses nipple discharge or skin changes. ECOG PERFORMANCE STATUS: 1 - Symptomatic but completely ambulatory  Blood pressure 105/67, pulse 89, temperature 97.9 F (36.6 C), temperature source Oral, height 5\' 4"  (1.626 m), weight 258 lb 9.6 oz (117.3 kg), last menstrual period 03/02/2012.  LABORATORY DATA: Lab Results  Component Value Date   WBC 8.4 04/02/2012   HGB 9.6* 04/02/2012   HCT 28.5* 04/02/2012  MCV 89.3 04/02/2012   PLT 70* 04/02/2012      Chemistry      Component Value Date/Time   NA 136 03/26/2012 1249   K 3.5 03/26/2012 1249   CL 107 03/26/2012 1249   CO2 18* 03/26/2012 1249   BUN 30* 03/26/2012 1249   CREATININE 1.21* 03/26/2012 1249      Component Value Date/Time   CALCIUM 8.2* 03/26/2012 1249   ALKPHOS 299* 03/26/2012 1249   AST 165* 03/26/2012 1249   ALT 72* 03/26/2012 1249   BILITOT 8.3* 03/26/2012 1249       RADIOGRAPHIC STUDIES:  ASSESSMENT: 46 year old female with  #1 new diagnosis of 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 Patient is status post  needle core biopsy of the right breast mass. The final pathology revealed a invasive ductal carcinoma high-grade with LDI tumor ER +5% PR -0% HER-2/neu overexpressed 3+ Ki-67 50%.  #2 patient's staging studies reveal widely metastatic disease including liver and bone metastases and nodal metastasis.  #3 recommendation is Herceptin based systemic chemotherapy. I have recommended Taxotere carboplatinum and Herceptin. The Taxotere carboplatinum will be given every 3 weeks with Herceptin given weekly. The Taxotere carboplatinum will be given for a total of 6 cycles. She will receive day 2 Neulasta.  #4 after patient has had at least 3-4 cycles of chemotherapy we will do restaging studies including a PET/CT's.  #5 patient will eventually require ongoing therapy with Herceptin and antiestrogen likely consisting of tamoxifen since patient is premenopausal.  #6 for bony metastasis we will start XGeva every 28 days.   PLAN:   #1 She will proceed with Herceptin only today. And return in one week's time for her scheduled Taxotere carboplatinum and Herceptin. However prior to receiving that I will have to reevaluate whether or not she will be able to receive this.  #2 I will obtain x-rays of the face to make sure that there are no fractures of the facial bones. We will also obtain a x-ray of the hips.  #3 followup on her LFTs  #4 Patient's daughter and patient are contemplating moving her care [place] so that the patient can be closer to her daughter. I do think that this is very appropriate and I will do whatever I can to help them make that transition.  All questions were answered. The patient knows to call the clinic with any problems, questions or concerns. We can certainly see the patient much sooner if necessary.  I spent 25 minutes counseling the patient face to face. The total time spent in the appointment was 30 minutes.    Drue Second, MD Medical/Oncology [facility] [phone] (beeper) [phone] (Office)  04/02/2012, 2:11 PM

## 2012-04-02 NOTE — Progress Notes (Signed)
OFFICE PROGRESS NOTE  CC Dr. Emelia Loron  DIAGNOSIS: 46 year old female with New diagnosis of widely metastatic Breast cancer diagnosed 02/14/2012 by a needle core biopsy of the right breast. Her needle core biopsy was performed at [facility] and the pathology was reviewed by Dr. Delena Bali. Her was ER-positive 5% PR negative HER-2/neu overexpressed Ki-67 50%  PRIOR THERAPY:  #1 patient originally presented to my clinic on 01/23/2012 with a right breast mass and flank pain. At that time I ordered staging studies and ordered mammogram as well as an ultrasound.The ultrasound showed a irregular hypoechoic mass in the 9:00 position 11 cm from the nipple measuring 2.9 x 2.8 x 3.3 cm. Another irregular hypo-: Mass was noted in the 9:00 position 9 cm from the nipple measuring 0.9 x 1.0 x 0.9 cm skin thickening was noted.And abnormal node was imaged in the right axilla with loss of fatty hilum measuring 1.9 x 0.2 x 1.1 cm. Left breast was negative for a malignancy. Performed on the same day revealed in the right breast posteriorly in the upper outer quadrant irregular spiculated mass 1 cm anterior in the upper outer quadrant area measured 4.8 x 6.7 cm in conglomeration with skin thickening.  #2 PET CT revealed 2 hypermetabolic masses in the posterior right breast consistent with primary breast carcinoma hypermetabolic subcutaneous tissue on the right possibly representing inflammatory carcinoma. There was a local right axillary nodal metastasis. Also noted were hypermetabolic internal mammary nodal metastasis on the right. Hepatic metastases with large tumor burden within the left and right hepatic lobe the lesions were intensely hypermetabolic with SUV 15.0 there also noted to be periportal nodal metastases. Widespread hypermetabolic skeletal metastases involving the axillary and appendicular skeleton including the skull base. There are ill-defined pulmonary nodules without associated metabolic  activity.Vicki Sanders #3 patient had a right breast biopsy performed on 02/14/2012. 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 The prognostic panel reveals estrogen receptor 5% moderately staining progesterone receptor 0% HER-2/neu strong +3 and overexpressed proliferation marker Ki-67 50% and elevated.  3 patient has been seen by Dr. Emelia Loron in consultation  CURRENT THERAPY: Palliative chemotherapy with [facility], here for Herceptin only today  INTERVAL HISTORY: Vicki Sanders 46 y.o. female returns for follow up visit today.She was recently hospitalized with severe pain dehydration jaundice and pancytopenia. She remained in the hospital for about a week. During her hospitalization she did get her Herceptin. She was discharged about 4 days ago. Patient's daughter called [place] several times over the last 48 hours stating that her mother was not doing well at the skilled nursing facility. She was incoherent not eating much she was dehydrated. Patient already had an appointment with [place] today and so she was brought in early. Patient states that she just feels very tired fatigued she has extreme nausea as well as ongoing vomiting. She does have a rotation 6 and reflux symptoms as well. She otherwise is denying any pain. She has significant grade 3 weakness and fatigue. She has not had headaches blurring of vision no back pain. Remainder of the 10 point review of systems is unremarkable MEDICAL HISTORY: Past Medical History  Diagnosis Date  . Diabetes mellitus   . Arthritis   . Cancer 01/23/2012    ALLERGIES:  is  allergic to cephalosporins.  MEDICATIONS:  Current Outpatient Prescriptions  Medication Sig Dispense Refill  . dexamethasone  (DECADRON) 4 MG tablet Take 2 tablets two times a day the day before Taxotere. Then take 2 tabs two times a day starting the day after chemo for 3 days.  30 tablet  1  . fentaNYL (DURAGESIC - DOSED MCG/HR) 50 MCG/HR Place 1 100 mcg and 1 12.5 mcg patch onto skin every 72 hours.  Disp 5 of each type of patch.  5 patch  0  . insulin aspart (NOVOLOG) 100 UNIT/ML injection Inject 0-15 Units into the skin 3 (three) times daily with meals.  1 vial    . insulin aspart (NOVOLOG) 100 UNIT/ML injection Inject 0-5 Units into the skin at bedtime.  1 vial    . lidocaine-prilocaine (EMLA) cream Apply topically as needed.  30 g  6  . ondansetron (ZOFRAN) 8 MG tablet Take 1 tablet two times a day starting the day after chemo for 3 days. Then take 1 tab two times a day as needed for nausea or vomiting.  30 tablet  1  . PRESCRIPTION MEDICATION [facility] at [facility]. Pt's last treatment was on 03-05-12 followed by Dr Eustace Pen.      . prochlorperazine (COMPAZINE) 10 MG tablet Take 1 tablet (10 mg total) by mouth every 6 (six) hours as needed (Nausea or vomiting).  30 tablet  1  . prochlorperazine (COMPAZINE) 25 MG suppository Place 1 suppository (25 mg total) rectally every 12 (twelve) hours as needed for nausea.  12 suppository  3  . ranitidine (ZANTAC) 150 MG tablet Take 150 mg by mouth 2 (two) times daily.      . sucralfate (CARAFATE) 1 GM/10ML suspension Take 10 mLs (1 g total) by mouth 4 (four) times daily.  100 mL  0    SURGICAL HISTORY:  Past Surgical History  Procedure Date  . Shoulder arthrotomy   . Tonsillectomy   . Appendectomy   . Cesarean section     REVIEW OF SYSTEMS:  Pertinent items are noted in HPI.   PHYSICAL EXAMINATION: General appearance: alert, cooperative, appears older than stated age, fatigued, flushed, mild distress, mildly obese and pale Neck: no adenopathy, no carotid bruit, no JVD, supple, symmetrical, trachea midline and thyroid not enlarged, symmetric, no tenderness/mass/nodules Lymph  nodes: Cervical, supraclavicular, and axillary nodes normal. Resp: clear to auscultation bilaterally and normal percussion bilaterally Back: he does have some tenderness diffusely Cardio: regular rate and rhythm, S1, S2 normal, no murmur, click, rub or gallop GI: abdomen is soft liver is palpable 2 fingerbreadths below the right costal margin no splenomegaly no other palpable masses bowel sounds are present Extremities: extremities normal, atraumatic, no cyanosis or edema Neurologic: Grossly normal Bilateral breast examination right breast reveals a palpable mass measuring about 5 cm it is tender to palpation no nipple discharge there are some thickening of the skin and skin changes. Left breast no masses nipple discharge or skin changes. ECOG PERFORMANCE STATUS: 1 - Symptomatic but completely ambulatory  Blood pressure 105/67, pulse 89, temperature 97.9 F (36.6 C), temperature source Oral, height 5\' 4"  (1.626 m), weight 258 lb 9.6 oz (117.3 kg), last menstrual period 03/02/2012.  LABORATORY DATA: Lab Results  Component Value Date   WBC 8.4 04/02/2012   HGB 9.6* 04/02/2012   HCT 28.5* 04/02/2012   MCV 89.3 04/02/2012   PLT 70* 04/02/2012      Chemistry      Component Value Date/Time  NA 136 03/26/2012 1249   K 3.5 03/26/2012 1249   CL 107 03/26/2012 1249   CO2 18* 03/26/2012 1249   BUN 30* 03/26/2012 1249   CREATININE 1.21* 03/26/2012 1249      Component Value Date/Time   CALCIUM 8.2* 03/26/2012 1249   ALKPHOS 299* 03/26/2012 1249   AST 165* 03/26/2012 1249   ALT 72* 03/26/2012 1249   BILITOT 8.3* 03/26/2012 1249       RADIOGRAPHIC STUDIES:  ASSESSMENT: 46 year old female with  #1 new diagnosis of 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 Patient is status post needle core biopsy of the right breast mass. The final pathology revealed a invasive ductal carcinoma high-grade  with LVI tumor ER +5% PR -0% HER-2/neu overexpressed 3+ Ki-67 50%.  #2 patient's staging studies reveal widely metastatic disease including liver and bone metastases and nodal metastasis.  #3 recommendation is Herceptin based systemic chemotherapy. I have recommended Taxotere carboplatinum and Herceptin. The Taxotere carboplatinum will be given every 3 weeks with Herceptin given weekly. The Taxotere carboplatinum will be given for a total of 6 cycles. She will receive day 2 Neulasta.  #4 after patient has had at least 3-4 cycles of chemotherapy we will do restaging studies including a PET/CT's.  #5 patient will eventually require ongoing therapy with Herceptin and antiestrogen likely consisting of tamoxifen since patient is premenopausal.  #6 for bony metastasis we will start XGeva every 28 days.  #7 dehydration nausea vomiting: Patient will continue to get IV fluids as well as antiemetics.   PLAN:   #1 patient was scheduled for Taxotere or and carboplatinum today but we will postpone this by at least 2 weeks. However she will receive Herceptin today.Her total bilirubin is slowly coming down it is 8.3 today. Remainder of the LFTs are slowly improving.  #2 patient will continue to receive IV fluids as well as antiemetics throughout this week. We will have nutrition meet with the patient to see what we can do in terms of her intake and build up her nutrition.  #3 patient after she completes her IV fluids will be discharged to skilled nursing facility. She'll be seen back tomorrow for IV fluids.  #4 patient's prognosis remains very guarded at this point.   All questions were answered. The patient knows to call the clinic with any problems, questions or concerns. We can certainly see the patient much sooner if necessary.  I spent 25 minutes counseling the patient face to face. The total time spent in the appointment was 30 minutes.    Drue Second, MD Medical/Oncology [facility] [phone] (beeper) [phone] (Office)  04/02/2012, 2:19 PM

## 2012-04-02 NOTE — Progress Notes (Signed)
Out-patient Oncology Nutrition Note  [name] is a 46 year old female patient of Dr. Welton Flakes, diagnosed with breast cancer.   Past Medical History  Diagnosis Date  . Diabetes mellitus   . Arthritis   . Cancer 01/23/2012   Current outpatient prescriptions:dexamethasone (DECADRON) 4 MG tablet, Take 2 tablets two times a day the day before Taxotere. Then take 2 tabs two times a day starting the day after chemo for 3 days., Disp: 30 tablet, Rfl: 1;  fentaNYL (DURAGESIC - DOSED MCG/HR) 50 MCG/HR, Place 1 100 mcg and 1 12.5 mcg patch onto skin every 72 hours.  Disp 5 of each type of patch., Disp: 5 patch, Rfl: 0 insulin aspart (NOVOLOG) 100 UNIT/ML injection, Inject 0-15 Units into the skin 3 (three) times daily with meals., Disp: 1 vial, Rfl: ;  insulin aspart (NOVOLOG) 100 UNIT/ML injection, Inject 0-5 Units into the skin at bedtime., Disp: 1 vial, Rfl: ;  lidocaine-prilocaine (EMLA) cream, Apply topically as needed., Disp: 30 g, Rfl: 6 ondansetron (ZOFRAN) 8 MG tablet, Take 1 tablet two times a day starting the day after chemo for 3 days. Then take 1 tab two times a day as needed for nausea or vomiting., Disp: 30 tablet, Rfl: 1;  PRESCRIPTION MEDICATION, [facility]. Pt's last treatment was on 03-05-12 followed by Dr Eustace Pen., Disp: , Rfl:  prochlorperazine (COMPAZINE) 10 MG tablet, Take 1 tablet (10 mg total) by mouth every 6 (six) hours as needed (Nausea or vomiting)., Disp: 30 tablet, Rfl: 1;  prochlorperazine (COMPAZINE) 25 MG suppository, Place 1 suppository (25 mg total) rectally every 12 (twelve) hours as needed for nausea., Disp: 12 suppository, Rfl: 3;  ranitidine (ZANTAC) 150 MG tablet, Take 150 mg by mouth 2 (two) times daily., Disp: , Rfl:  sucralfate (CARAFATE) 1 GM/10ML suspension, Take 10 mLs (1 g total) by mouth 4 (four) times daily., Disp: 100 mL, Rfl: 0 No current facility-administered medications for this visit. Facility-Administered Medications Ordered in Other Visits: 0.9 %   sodium chloride infusion, , Intravenous, Once, Victorino December, MD, Last Rate: 20 mL/hr at 04/02/12 1457;  acetaminophen (TYLENOL) tablet 650 mg, 650 mg, Oral, Once, Victorino December, MD, 650 mg at 04/02/12 1510;  denosumab (XGEVA) injection 120 mg, 120 mg, Subcutaneous, Once, Victorino December, MD, 120 mg at 04/02/12 1534 diphenhydrAMINE (BENADRYL) capsule 50 mg, 50 mg, Oral, Once, Victorino December, MD, 50 mg at 04/02/12 1511;  heparin lock flush 100 unit/mL, 500 Units, Intracatheter, Once PRN, Victorino December, MD;  sodium chloride 0.9 % injection 10 mL, 10 mL, Intracatheter, PRN, Victorino December, MD trastuzumab (HERCEPTIN) 189 mg in sodium chloride 0.9 % 250 mL chemo infusion, 2 mg/kg (Treatment Plan Actual), Intravenous, Once, Victorino December, MD, Last Rate: 518 mL/hr at 04/02/12 1528, 189 mg at 04/02/12 1528  CMP     Component Value Date/Time   NA 136 03/26/2012 1249   K 3.5 03/26/2012 1249   CL 107 03/26/2012 1249   CO2 18* 03/26/2012 1249   GLUCOSE 159* 03/26/2012 1249   BUN 30* 03/26/2012 1249   CREATININE 1.21* 03/26/2012 1249   CALCIUM 8.2* 03/26/2012 1249   PROT 4.4* 03/26/2012 1249   ALBUMIN 1.7* 03/26/2012 1249   AST 165* 03/26/2012 1249   ALT 72* 03/26/2012 1249   ALKPHOS 299* 03/26/2012 1249   BILITOT 8.3* 03/26/2012 1249   GFRNONAA 38* 03/20/2012 0935   GFRAA 44* 03/20/2012 0935    Wt Readings from Last 10 Encounters:  04/02/12 267 lb (121.11 kg)  04/02/12 267 lb 4 oz (121.224 kg)  04/02/12 258 lb 9.6 oz (117.3 kg)  03/06/12 206 lb 9.1 oz (93.7 kg)  02/28/12 206 lb (93.441 kg)  02/27/12 206 lb 14.4 oz (93.849 kg)  02/23/12 211 lb (95.709 kg)  02/20/12 211 lb 9.6 oz (95.981 kg)  02/09/12 213 lb 1.6 oz (96.662 kg)  02/05/12 212 lb 6.4 oz (96.344 kg)   Patient reported her appetite is getting better. She reported she lives in a nursing home. She stated the smell of the food there bothers her frequently. She reported she does not always like the food served, but she tries to eat her meals. She  reported her daughter brings her food, pudding lately which is easy for her to eat. She reported small changes in taste.   Nutrition Diagnosis: Nutrition related knowledge deficit r/t diagnosis of cancer a/e/b lack of exposure to nutrition information.   Intervention:  I have encouraged the patient to consume adequate PO intake of regular meals daily or 4 to 6 small meals daily if easier. I have educated the patient on symptom management for nausea and changes in taste. I have educated the patient on the plant based diet and nutrition for breast cancer. i provided patient handouts and encouraged patient to attend Breast cancer nutrition course. I informed her that if she is unable to attend, Her daughter could attend the class for the information.The patient was without any further nutrition related questions. Provided RD contact information and instructed patient to contact RD for future questions.   Monitor/ Evaluation: Patient to have adequate PO intake and weight maintenance during treatment.   Next visit: Patient agreed to contact RD for questions or concerns. Patient has been invited to attend breast cancer nutrition course.   RD available for nutrition needs.   Iven Finn, MS, RD, LDN [phone]

## 2012-04-02 NOTE — Progress Notes (Signed)
Pt taken via wheelchair to xray by Nurse Tech @1700 

## 2012-04-02 NOTE — Patient Instructions (Addendum)
1. X-rays of the face and hips today  2. XGeva for bones and herceptin today  3. Return to clinic in 1 week

## 2012-04-03 ENCOUNTER — Telehealth: Payer: Self-pay | Admitting: *Deleted

## 2012-04-03 NOTE — Telephone Encounter (Signed)
Please let the daughter know that I agree with her decision.

## 2012-04-03 NOTE — Telephone Encounter (Signed)
Pt's daughter [name] called states "I'm going to have to transfer my mom's care to [facility] so she can be closer to me. The place where she is not what she needs. I'd like her to continue to see Dr. Welton Flakes but, the facility where she will stay will not pay for transportation to [facility]. I will have to move her to [facility]." Discussed with Daughter pt's medical records will need to be requested by [facility]. Fax# for medical records given  Daughter denied needing further assistance at this time. Will review with MD..

## 2012-04-04 ENCOUNTER — Other Ambulatory Visit: Payer: Self-pay | Admitting: *Deleted

## 2012-04-04 ENCOUNTER — Ambulatory Visit: Payer: Self-pay | Admitting: Internal Medicine

## 2012-04-04 DIAGNOSIS — C50919 Malignant neoplasm of unspecified site of unspecified female breast: Secondary | ICD-10-CM

## 2012-04-08 LAB — URINALYSIS, COMPLETE
Blood: NEGATIVE
Ketone: NEGATIVE
Nitrite: NEGATIVE
Ph: 6 (ref 4.5–8.0)
Protein: NEGATIVE
RBC,UR: NONE SEEN /HPF (ref 0–5)
Specific Gravity: 1.024 (ref 1.003–1.030)
WBC UR: 1 /HPF (ref 0–5)

## 2012-04-08 LAB — COMPREHENSIVE METABOLIC PANEL
Albumin: 1.8 g/dL — ABNORMAL LOW (ref 3.4–5.0)
Anion Gap: 12 (ref 7–16)
BUN: 22 mg/dL — ABNORMAL HIGH (ref 7–18)
Chloride: 107 mmol/L (ref 98–107)
Creatinine: 1.25 mg/dL (ref 0.60–1.30)
EGFR (African American): >60
Glucose: 408 mg/dL — ABNORMAL HIGH (ref 65–99)
Osmolality: 302 (ref 275–301)
Potassium: 3.8 mmol/L (ref 3.5–5.1)
SGOT(AST): 84 U/L — ABNORMAL HIGH (ref 15–37)
Sodium: 141 mmol/L (ref 136–145)
Total Protein: 4.3 g/dL — ABNORMAL LOW (ref 6.4–8.2)

## 2012-04-08 LAB — CBC
HCT: 28.1 % — ABNORMAL LOW (ref 35.0–47.0)
MCH: 33.5 pg (ref 26.0–34.0)
MCHC: 34 g/dL (ref 32.0–36.0)
MCV: 99 fL (ref 80–100)
Platelet: 51 10*3/uL — ABNORMAL LOW (ref 150–440)
RBC: 2.85 10*6/uL — ABNORMAL LOW (ref 3.80–5.20)
RDW: 28.2 % — ABNORMAL HIGH (ref 11.5–14.5)

## 2012-04-09 ENCOUNTER — Telehealth: Payer: Self-pay | Admitting: *Deleted

## 2012-04-09 ENCOUNTER — Encounter: Payer: Self-pay | Admitting: Oncology

## 2012-04-09 ENCOUNTER — Other Ambulatory Visit: Payer: Self-pay | Admitting: Lab

## 2012-04-09 ENCOUNTER — Ambulatory Visit: Payer: Self-pay

## 2012-04-09 ENCOUNTER — Inpatient Hospital Stay: Payer: Self-pay | Admitting: Internal Medicine

## 2012-04-09 LAB — COMPREHENSIVE METABOLIC PANEL
Alkaline Phosphatase: 304 U/L — ABNORMAL HIGH (ref 50–136)
BUN: 21 mg/dL — ABNORMAL HIGH (ref 7–18)
Bilirubin,Total: 9.3 mg/dL — ABNORMAL HIGH (ref 0.2–1.0)
Calcium, Total: 7.7 mg/dL — ABNORMAL LOW (ref 8.5–10.1)
Chloride: 108 mmol/L — ABNORMAL HIGH (ref 98–107)
Co2: 22 mmol/L (ref 21–32)
Creatinine: 1.12 mg/dL (ref 0.60–1.30)
EGFR (African American): >60
EGFR (Non-African Amer.): 59 — ABNORMAL LOW
Osmolality: 300 (ref 275–301)
Potassium: 3.9 mmol/L (ref 3.5–5.1)
SGOT(AST): 83 U/L — ABNORMAL HIGH (ref 15–37)
SGPT (ALT): 74 U/L (ref 12–78)
Sodium: 141 mmol/L (ref 136–145)

## 2012-04-09 NOTE — Telephone Encounter (Signed)
FTKA 04/09/12. Call to pt's daughter [name] regarding pt missed appt. Per [name], pt was taken to [facility] yesterday. Blood sugar was 524, pt is taking clothes off, trying to pull port out, catheter out. [name] advised she has turned in the medical records form for all pt's records to be sent to [facility]. [name] has already contacted an Best boy at [facility] regarding pt's care. Discussed with [name] pt may be able to be direct admit from [facility]. She plans on going to see pt tonight and will discuss this with pt's MD. [name] thanked [place] for the help, advised she will be taking her mom to [facility] for further care/treatment. Until then pt will be at [facility]. Confirmed with [name] the above information and advised I will give MD information andnote will be made in chart regarding pt's treatments going forward.  [name] confirmed this is correct and verbalized understanding. Will review with MD

## 2012-04-10 ENCOUNTER — Ambulatory Visit: Payer: Self-pay

## 2012-04-10 DIAGNOSIS — I059 Rheumatic mitral valve disease, unspecified: Secondary | ICD-10-CM

## 2012-04-10 LAB — CBC WITH DIFFERENTIAL/PLATELET
Bands: 1 %
Comment - H1-Com7: NORMAL
Lymphocytes: 4 %
MCHC: 33 g/dL (ref 32.0–36.0)
MCV: 98 fL (ref 80–100)
Monocytes: 3 %
Platelet: 47 10*3/uL — ABNORMAL LOW (ref 150–440)
RBC: 2.52 10*6/uL — ABNORMAL LOW (ref 3.80–5.20)
RDW: 28.4 % — ABNORMAL HIGH (ref 11.5–14.5)
WBC: 3.3 10*3/uL — ABNORMAL LOW (ref 3.6–11.0)

## 2012-04-10 LAB — URINE CULTURE

## 2012-04-10 NOTE — Progress Notes (Signed)
This encounter was created in error - please disregard.

## 2012-04-11 ENCOUNTER — Inpatient Hospital Stay (HOSPITAL_COMMUNITY): Admission: RE | Admit: 2012-04-11 | Payer: Medicaid Other | Source: Ambulatory Visit

## 2012-04-11 LAB — CBC WITH DIFFERENTIAL/PLATELET
Eosinophil: 1 %
HCT: 26.4 % — ABNORMAL LOW
HGB: 9.1 g/dL — ABNORMAL LOW
Lymphocytes: 8 %
MCH: 34.1 pg — ABNORMAL HIGH
MCHC: 34.6 g/dL
MCV: 99 fL
Monocytes: 3 %
NRBC/100 WBC: 2 /
Platelet: 42 x10 3/mm 3 — ABNORMAL LOW
RBC: 2.68 X10 6/mm 3 — ABNORMAL LOW
RDW: 29.1 % — ABNORMAL HIGH
Segmented Neutrophils: 88 %
WBC: 3.2 x10 3/mm 3 — ABNORMAL LOW

## 2012-04-11 LAB — COMPREHENSIVE METABOLIC PANEL
Albumin: 1.7 g/dL — ABNORMAL LOW (ref 3.4–5.0)
Alkaline Phosphatase: 294 U/L — ABNORMAL HIGH (ref 50–136)
Anion Gap: 9 (ref 7–16)
BUN: 20 mg/dL — ABNORMAL HIGH (ref 7–18)
Calcium, Total: 7.2 mg/dL — ABNORMAL LOW (ref 8.5–10.1)
Chloride: 99 mmol/L (ref 98–107)
Creatinine: 1.21 mg/dL (ref 0.60–1.30)
EGFR (African American): >60
Glucose: 388 mg/dL — ABNORMAL HIGH (ref 65–99)
Potassium: 3.8 mmol/L (ref 3.5–5.1)
SGOT(AST): 78 U/L — ABNORMAL HIGH (ref 15–37)
SGPT (ALT): 74 U/L (ref 12–78)
Sodium: 134 mmol/L — ABNORMAL LOW (ref 136–145)
Total Protein: 4.3 g/dL — ABNORMAL LOW (ref 6.4–8.2)

## 2012-04-11 LAB — VANCOMYCIN, TROUGH: Vancomycin, Trough: 23 ug/mL (ref 10–20)

## 2012-04-12 LAB — BASIC METABOLIC PANEL
Anion Gap: 8 (ref 7–16)
BUN: 26 mg/dL — ABNORMAL HIGH (ref 7–18)
Chloride: 100 mmol/L (ref 98–107)
Creatinine: 1.21 mg/dL (ref 0.60–1.30)
EGFR (African American): >60
Osmolality: 291 (ref 275–301)
Sodium: 137 mmol/L (ref 136–145)

## 2012-04-12 LAB — CBC WITH DIFFERENTIAL/PLATELET
Basophil %: 0.4 %
Eosinophil %: 0.2 %
HCT: 28.2 % — ABNORMAL LOW (ref 35.0–47.0)
HGB: 9.5 g/dL — ABNORMAL LOW (ref 12.0–16.0)
Lymphocyte #: 0.4 10*3/uL — ABNORMAL LOW (ref 1.0–3.6)
MCH: 33.6 pg (ref 26.0–34.0)
MCV: 99 fL (ref 80–100)
Monocyte #: 0.2 x10 3/mm (ref 0.2–0.9)
Neutrophil #: 3.3 10*3/uL (ref 1.4–6.5)
RBC: 2.84 10*6/uL — ABNORMAL LOW (ref 3.80–5.20)
WBC: 3.9 10*3/uL (ref 3.6–11.0)

## 2012-04-12 LAB — CULTURE, BLOOD (SINGLE)

## 2012-04-13 LAB — BASIC METABOLIC PANEL
BUN: 27 mg/dL — ABNORMAL HIGH (ref 7–18)
Calcium, Total: 7.3 mg/dL — ABNORMAL LOW (ref 8.5–10.1)
Chloride: 101 mmol/L (ref 98–107)
Creatinine: 1.17 mg/dL (ref 0.60–1.30)
Glucose: 246 mg/dL — ABNORMAL HIGH (ref 65–99)
Potassium: 3.6 mmol/L (ref 3.5–5.1)
Sodium: 138 mmol/L (ref 136–145)

## 2012-04-13 LAB — CBC WITH DIFFERENTIAL/PLATELET
Eosinophil %: 0.4 %
HCT: 27.6 % — ABNORMAL LOW (ref 35.0–47.0)
MCH: 34.1 pg — ABNORMAL HIGH (ref 26.0–34.0)
MCHC: 34.6 g/dL (ref 32.0–36.0)
Monocyte #: 0.2 x10 3/mm (ref 0.2–0.9)
Neutrophil %: 79.3 %
Platelet: 38 10*3/uL — ABNORMAL LOW (ref 150–440)
RBC: 2.8 10*6/uL — ABNORMAL LOW (ref 3.80–5.20)
RDW: 29.2 % — ABNORMAL HIGH (ref 11.5–14.5)
WBC: 3.7 10*3/uL (ref 3.6–11.0)

## 2012-04-13 LAB — CULTURE, BLOOD (SINGLE)

## 2012-04-15 LAB — COMPREHENSIVE METABOLIC PANEL
Albumin: 1.7 g/dL — ABNORMAL LOW (ref 3.4–5.0)
Alkaline Phosphatase: 319 U/L — ABNORMAL HIGH (ref 50–136)
Calcium, Total: 7.4 mg/dL — ABNORMAL LOW (ref 8.5–10.1)
Co2: 32 mmol/L (ref 21–32)
Creatinine: 0.93 mg/dL (ref 0.60–1.30)
EGFR (African American): >60
Glucose: 100 mg/dL — ABNORMAL HIGH (ref 65–99)
Osmolality: 289 (ref 275–301)
SGOT(AST): 128 U/L — ABNORMAL HIGH (ref 15–37)
SGPT (ALT): 120 U/L — ABNORMAL HIGH (ref 12–78)
Sodium: 143 mmol/L (ref 136–145)

## 2012-04-15 LAB — CULTURE, BLOOD (SINGLE)

## 2012-04-16 ENCOUNTER — Other Ambulatory Visit: Payer: Medicaid Other | Admitting: Lab

## 2012-04-16 ENCOUNTER — Telehealth: Payer: Self-pay | Admitting: *Deleted

## 2012-04-16 ENCOUNTER — Ambulatory Visit: Payer: Self-pay

## 2012-04-16 ENCOUNTER — Ambulatory Visit: Payer: Medicaid Other | Admitting: Oncology

## 2012-04-16 NOTE — Telephone Encounter (Signed)
Per desk RN I have canceled all appts. Pt transferred to [facility]. JMW

## 2012-04-23 ENCOUNTER — Ambulatory Visit: Payer: Medicaid Other | Admitting: Oncology

## 2012-04-23 ENCOUNTER — Other Ambulatory Visit: Payer: Medicaid Other | Admitting: Lab

## 2012-04-23 ENCOUNTER — Ambulatory Visit: Payer: Self-pay

## 2012-04-30 ENCOUNTER — Ambulatory Visit: Payer: Self-pay

## 2012-04-30 ENCOUNTER — Ambulatory Visit: Payer: Self-pay | Admitting: Oncology

## 2012-04-30 ENCOUNTER — Other Ambulatory Visit: Payer: Self-pay | Admitting: Lab

## 2012-05-01 ENCOUNTER — Ambulatory Visit: Payer: Self-pay

## 2012-05-05 ENCOUNTER — Ambulatory Visit: Payer: Self-pay | Admitting: Internal Medicine

## 2012-05-05 DEATH — deceased

## 2012-05-07 ENCOUNTER — Ambulatory Visit: Payer: Self-pay

## 2012-05-14 ENCOUNTER — Ambulatory Visit: Payer: Self-pay

## 2012-05-21 ENCOUNTER — Other Ambulatory Visit: Payer: Self-pay | Admitting: Lab

## 2012-05-21 ENCOUNTER — Ambulatory Visit: Payer: Self-pay

## 2012-05-21 ENCOUNTER — Ambulatory Visit: Payer: Self-pay | Admitting: Oncology

## 2012-05-22 ENCOUNTER — Ambulatory Visit: Payer: Self-pay

## 2012-05-28 ENCOUNTER — Ambulatory Visit: Payer: Self-pay

## 2012-06-04 ENCOUNTER — Ambulatory Visit: Payer: Self-pay

## 2012-06-11 ENCOUNTER — Other Ambulatory Visit: Payer: Self-pay | Admitting: Lab

## 2012-06-11 ENCOUNTER — Ambulatory Visit: Payer: Self-pay

## 2012-06-11 ENCOUNTER — Ambulatory Visit: Payer: Self-pay | Admitting: Oncology

## 2012-06-12 ENCOUNTER — Ambulatory Visit: Payer: Self-pay

## 2012-10-25 IMAGING — XA IR FLUORO GUIDE CV LINE*L*
1 series · 2 of 2 positions shown · non-contrast
Comparison: none

INDICATION: History of metastatic breast cancer, in need of
intravenous access for chemotherapy administration

[Series 300: line placements · 2 of 2 slices shown]
[im 1/2]
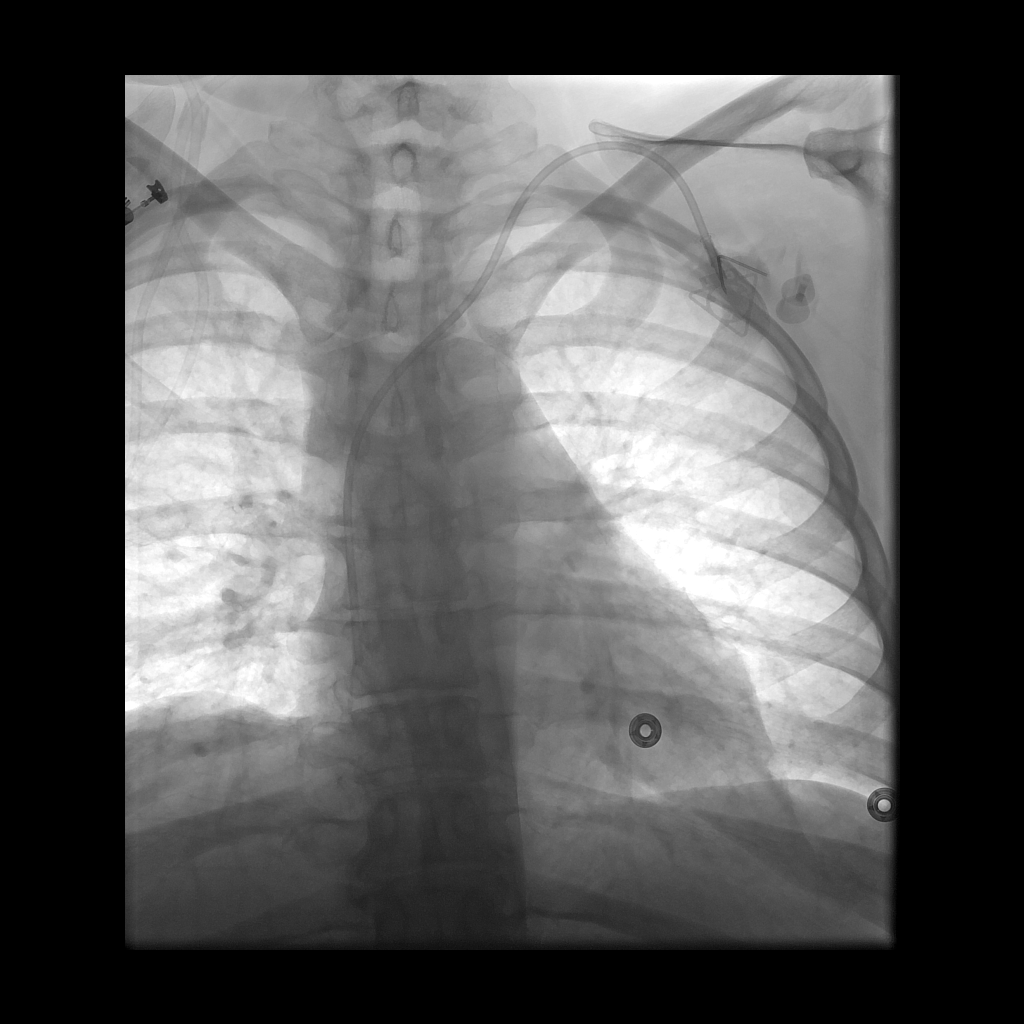
[im 2/2]
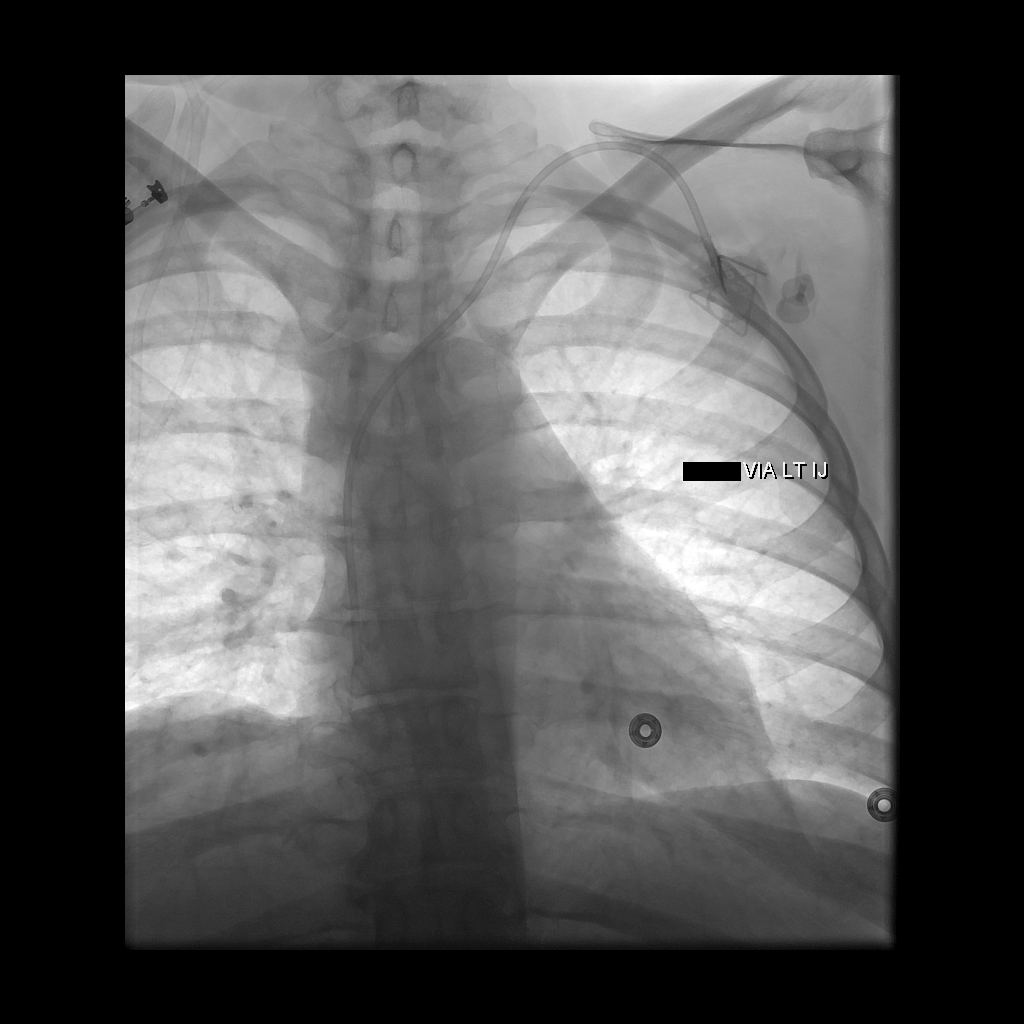

[2 of 2 positions shown; findings below may reference images not displayed]

IMPLANTED PORT A CATH PLACEMENT WITH ULTRASOUND AND FLUOROSCOPIC
GUIDANCE

Sedation: Versed 4 mg IV; Fentanyl 100 mcg IV; Vancomycin 1 gm IV;
IV antibiotic was given in an appropriate time interval prior to
skin puncture.

Total Moderate Sedation Time: 25 minutes.

Contrast: None

Fluoroscopy Time: 0.7 minutes.

Complications: None immediate

Procedure:

The procedure, risks, benefits, and alternatives were explained to
the patient.  Questions regarding the procedure were encouraged and
answered.  The patient understands and consents to the procedure.

Given history of right-sided breast cancer, the left anterior chest
was selected for port placement.  The left neck and chest were
prepped with chlorhexidine in a sterile fashion, and a sterile
drape was applied covering the operative field.  Maximum barrier
sterile technique with sterile gowns and gloves were used for the
procedure.  A timeout was performed prior to the initiation of the
procedure.  Local anesthesia was provided with 1% lidocaine with
epinephrine.

After creating a small venotomy incision, a micropuncture kit was
utilized to access the right internal jugular vein under direct,
real-time ultrasound guidance.  Ultrasound image documentation was
performed.  The microwire was kinked to measure appropriate
catheter length.

A subcutaneous port pocket was then created along the upper chest
wall utilizing a combination of sharp and blunt dissection.  The
pocket was irrigated with sterile saline.  A single lumen power
injectable port was chosen for placement.  The 8 Fr catheter was
tunneled from the port pocket site to the venotomy incision.  The
port was placed in the pocket.  The external catheter was trimmed
to appropriate length.  At the venotomy, an 8 Fr peel-away sheath
was placed over a guidewire under fluoroscopic guidance.  The
catheter was then placed through the sheath and the sheath was
removed.  Final catheter positioning was confirmed and documented
with a fluoroscopic spot radiograph.  The port was accessed with Fohlani
Piotr needle, aspirated and flushed with heparinized saline.

The venotomy site was closed with an interrupted 4-0 Vicryl suture.
The port pocket incision was closed with interrupted 2-0 Vicryl
suture and the skin was opposed with a running subcuticular 4-0
Vicryl suture.  Dermabond and Engr Muhammad were applied to both
incisions.  Dressings were placed.  The patient tolerated the
procedure well without immediate post procedural complication.
FINDINGS: After catheter placement, the tip lies at the superior aspect of
the right atrium.  The catheter aspirates and flushes normally and
is ready for immediate use.
IMPRESSION: Successful placement of a left internal jugular approach single
lumen power injectable Port-A-Cath.  The catheter is ready for
immediate use.

## 2012-12-03 IMAGING — CR DG PELVIS 1-2V
1 series · 1 of 1 positions shown · non-contrast
Comparison: CT pelvis 03/10/2012

CLINICAL DATA: Fall with bilateral hip pain.  Known bone metastatic
disease.

PELVIS - 1-2 VIEW

[t pelvis a.p.]
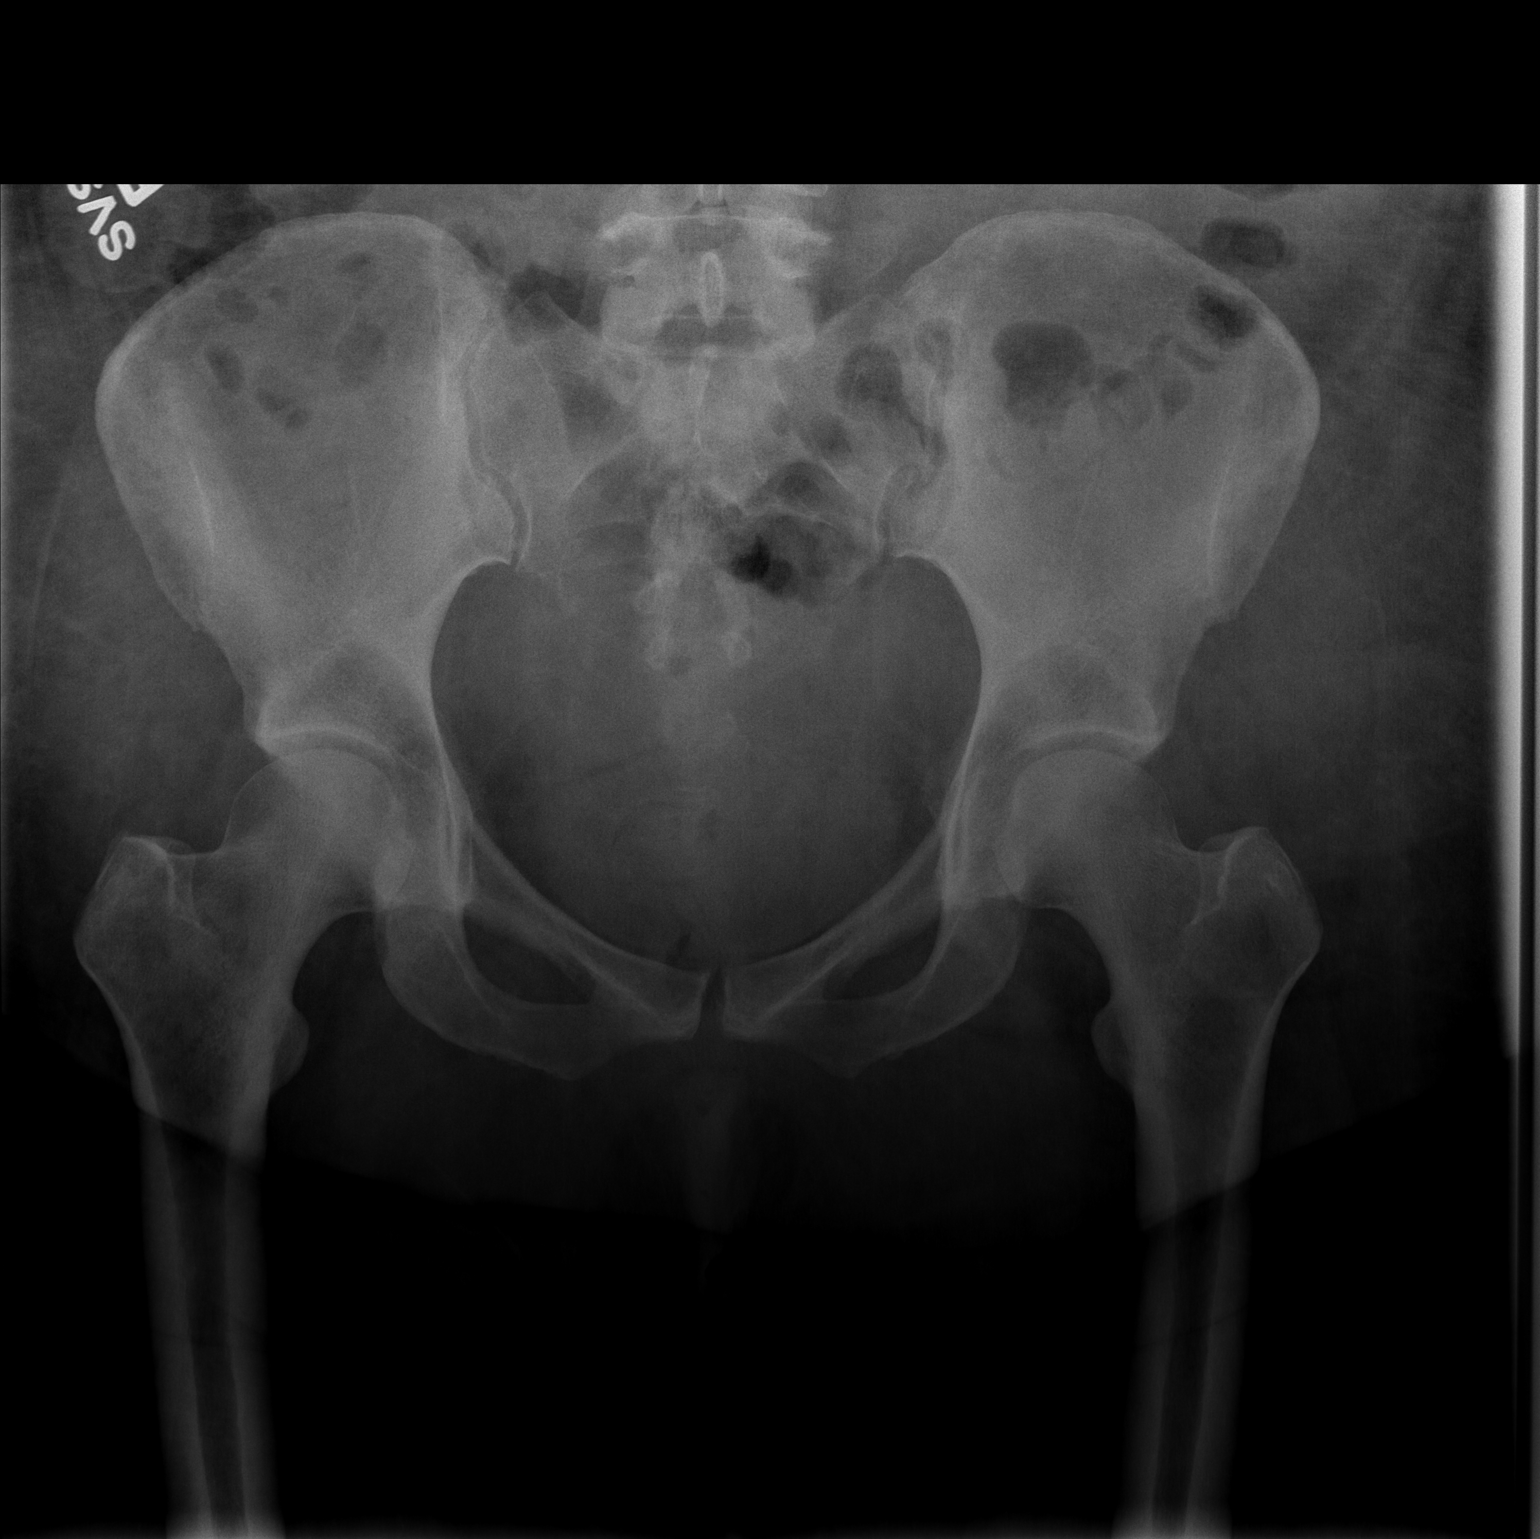

[1 of 1 positions shown; findings below may reference images not displayed]

FINDINGS: Single view of the pelvis was obtained.  The bony lesions
in the pelvis are not well characterized on this examination.  The
pelvic bony ring appears intact.  Both hips are grossly intact on
this single view.  The sacroiliac joints appear to be symmetric.
IMPRESSION: No acute bony abnormality in the pelvis.

## 2012-12-09 IMAGING — CT CT HEAD WITHOUT CONTRAST
2 series · 16 of 30 positions shown, 20 images · non-contrast
Comparison: none

REASON FOR EXAM: altered mental status, bilateral black eyes, multiple
recent falls
COMMENTS:

PROCEDURE:     CT  - CT HEAD WITHOUT CONTRAST  - April 08, 2012  [DATE]
RESULT:     History: Altered mental status.
Comparison Study: No prior.

[Series 2: without · axial · non-contrast · 0.43mm/px · z∈[-98,+36]mm · 13 of 33 slices shown, 17 images]
[im 3/33  brain]
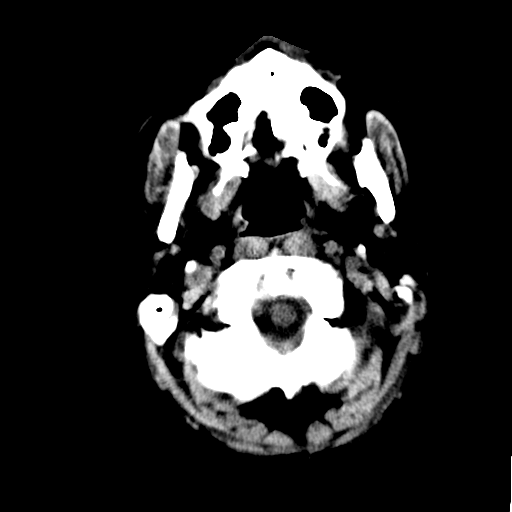
[im 3/33  bone]
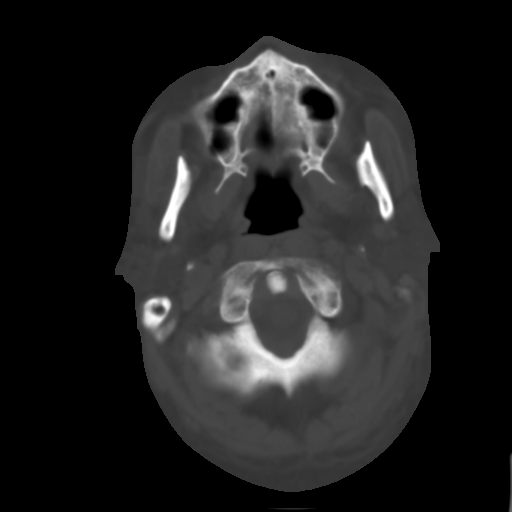
[im 5/33  brain]
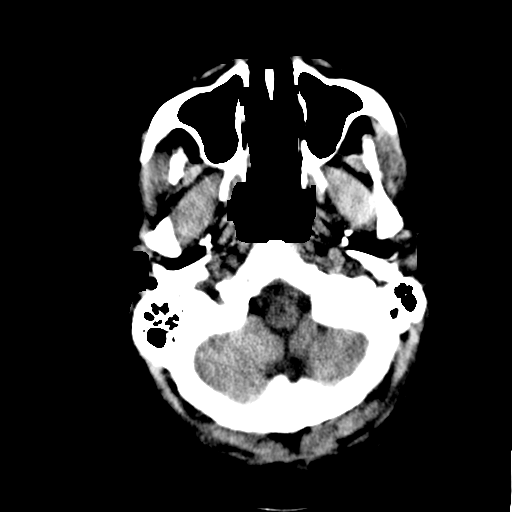
[im 7/33  brain]
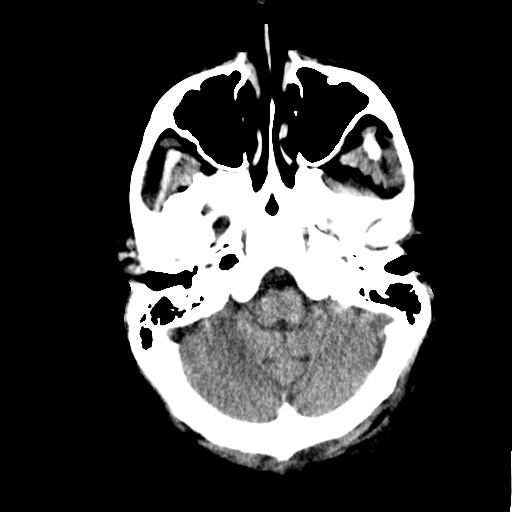
[im 10/33  brain]
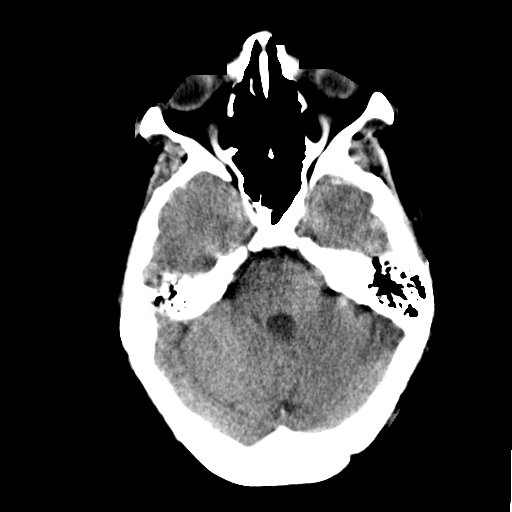
[im 12/33  brain]
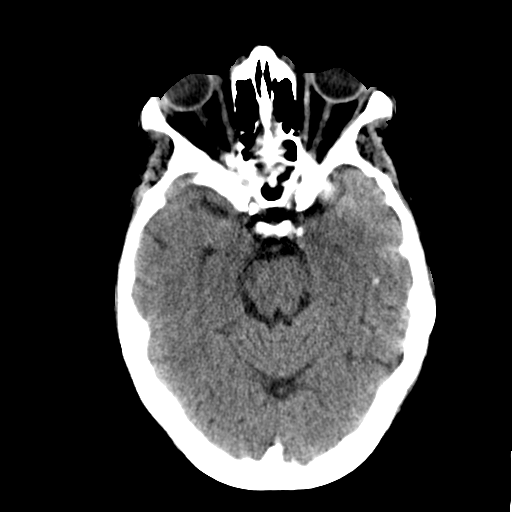
[im 12/33  bone]
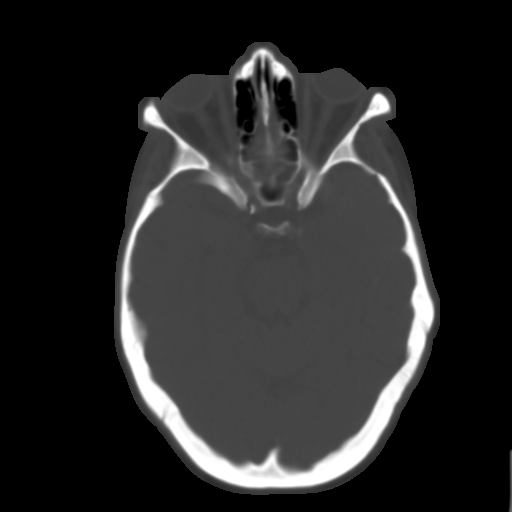
[im 14/33  brain]
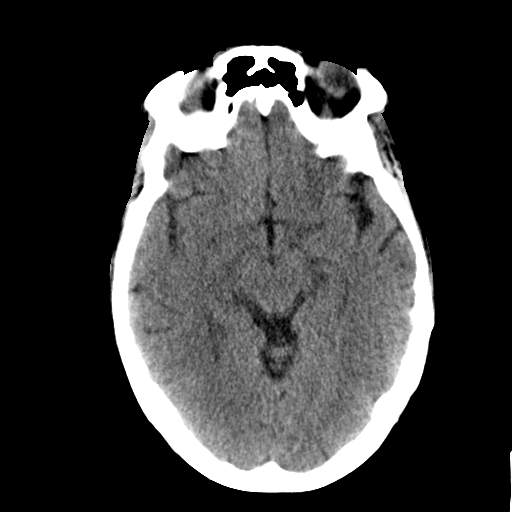
[im 17/33  brain]
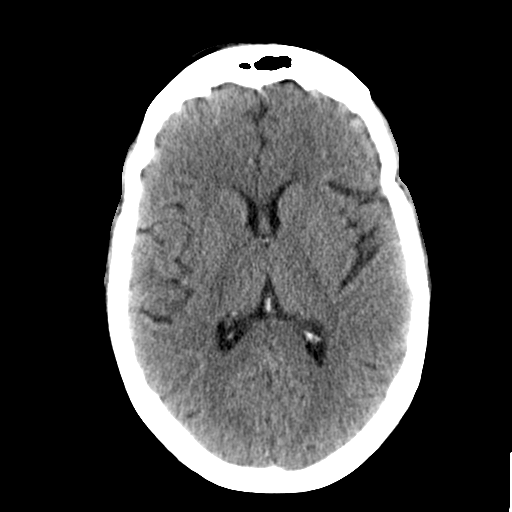
[im 19/33  brain]
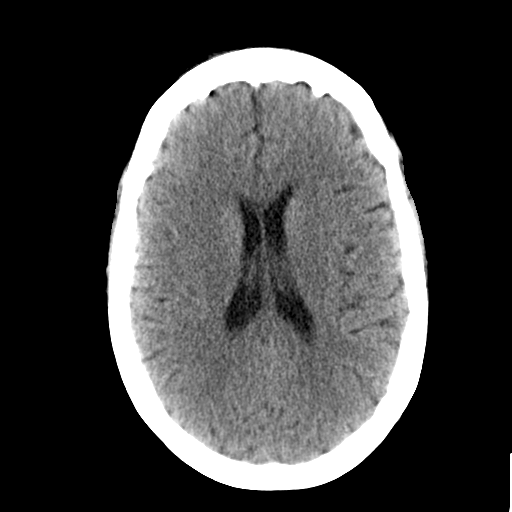
[im 21/33  brain]
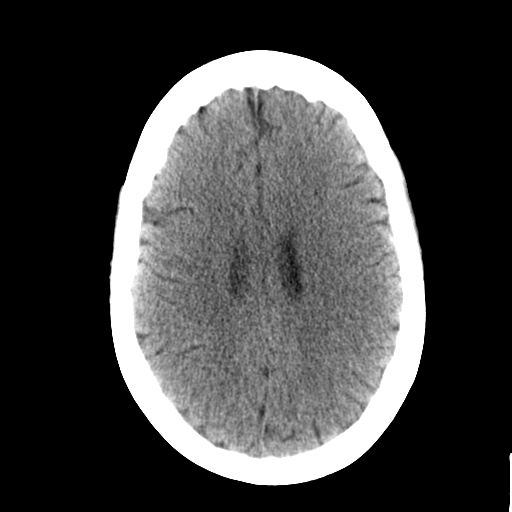
[im 21/33  bone]
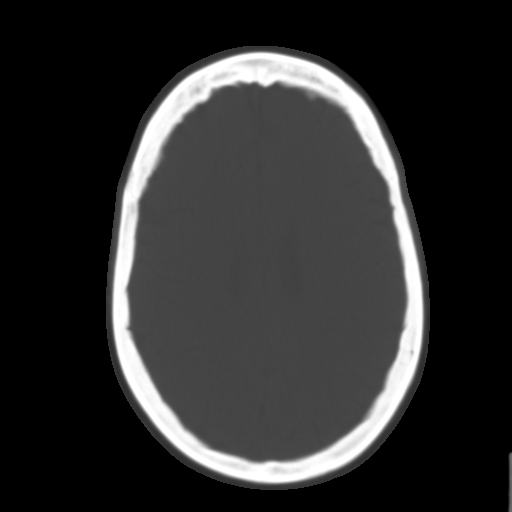
[im 23/33  brain]
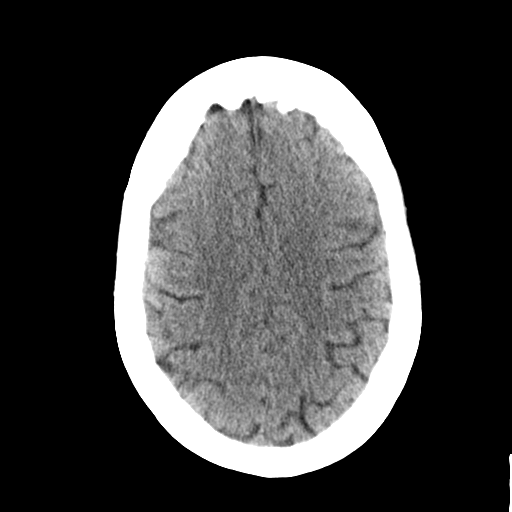
[im 26/33  brain]
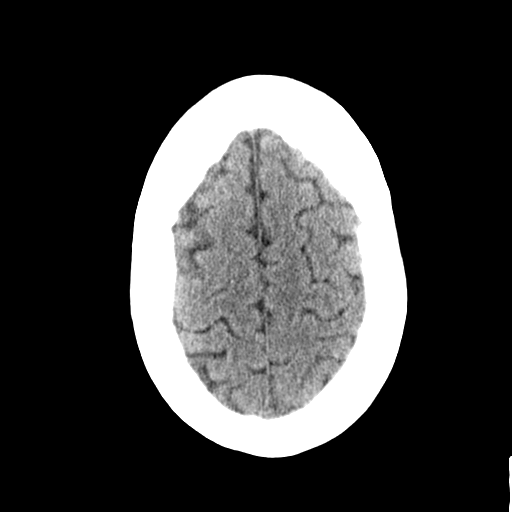
[im 28/33  brain]
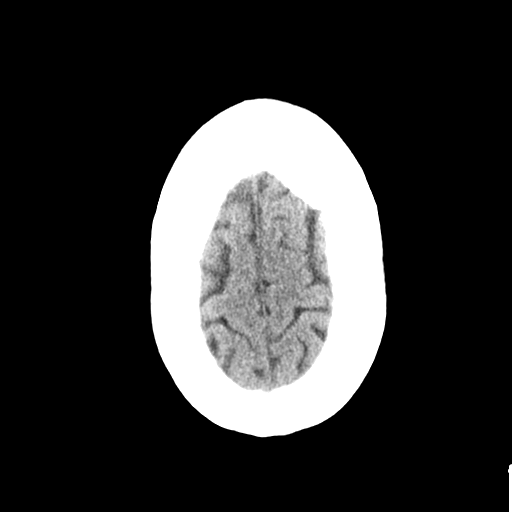
[im 30/33  brain]
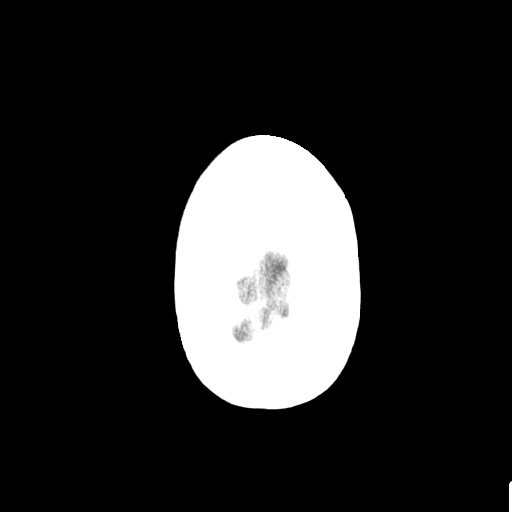
[im 30/33  bone]
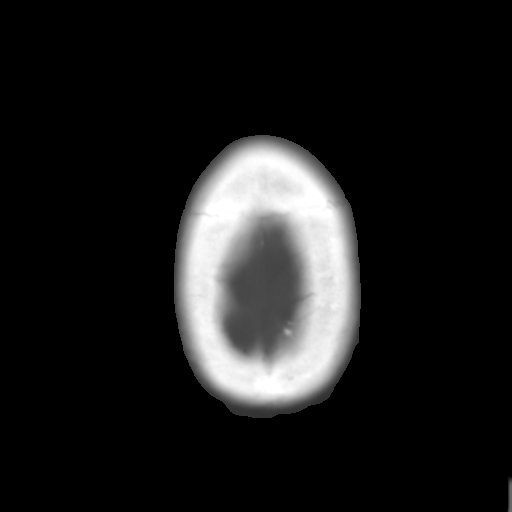

[Series 3: bone · axial · 0.43mm/px · z∈[-98,-54]mm · 3 of 33 slices shown]
[im 3/33  bone]
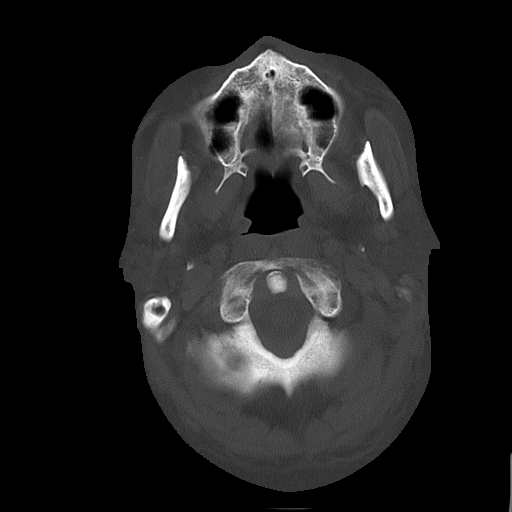
[im 7/33  bone]
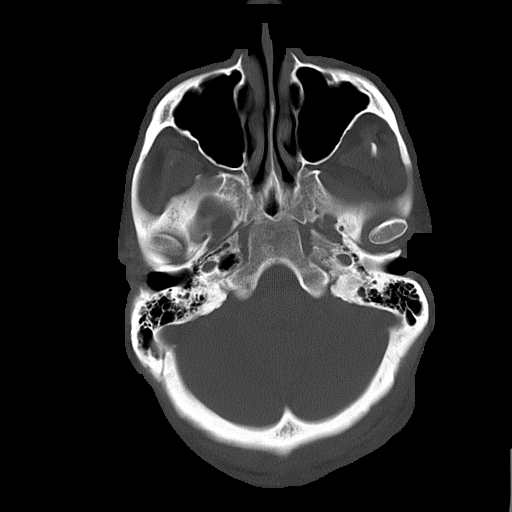
[im 12/33  bone]
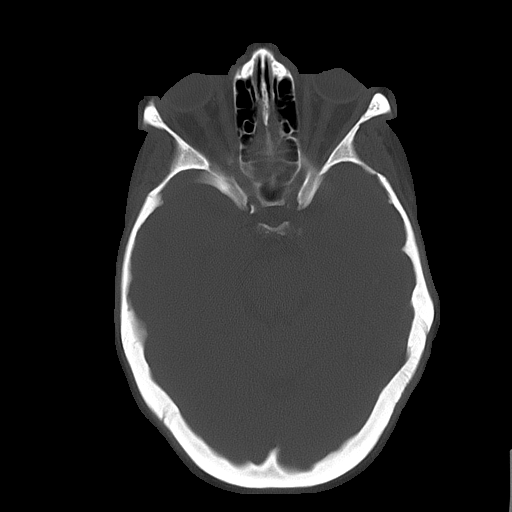

[16 of 30 positions shown; findings below may reference images not displayed]

FINDINGS: No mass. No hydrocephalus. No hemorrhage. No bony abnormality.
IMPRESSION: No acute abnormality.

## 2014-12-22 NOTE — Consult Note (Signed)
ONCOLOGY followup note - still very weak, denies pain issues. No fevers. Oral intake poor. Denies obvious bleeding Sxsweak-looking, otherwise resting and no acute distress.           vitals - 97.2, 74, 18, 115/71, 98% on room air           lungs - decreased breath sounds at bases           abdomen - soft, nontender, erythema better           Ext - pedal edema +WBC 3700, hemoglobin 9.5, platelets 38, Cr 0.93, LFT abnormal, Ca 72.48  49 year old female patient with recently diagnosed stage IV widely metastatic breast cancer diagnosed 02/14/2012, has liver, lung and bone metastasis and is on palliative chemotherapy by Dr. Humphrey Rolls in [place]. Per d/w Dr.Khan, patient got only one cycle of Taxotere/carboplatin around June 28th, since then has recived only Herceptin. Patient still has significant thrombocytopenia despite not getting cytotoxic chemo in > 6 weeks. This raises concern for bone marrow involvement by met breast Ca, also she is not a candidate for any systemic therapy at this time given declining condtion and poor performance status (ECOG 3-4). Agree with getting MRI brain to r/o brain mets. Otherwise recommend pursuing hopsice, have d/w her daughter over phone and she will think about placement at hopsice home,. Have explained that overall prognosis seems very poor. Patient is DNR. Continue to monitor CBC daily and transfuse as indicated (if hemoglobin drops to less than 7 or if severe anemia symptoms, and if platelet count drops to less than 20,000 or at higher counts if any major bleeding issues).    Electronic Signatures: Jonn Shingles (MD)  (Signed on 13-Aug-13 00:19)  Authored  Last Updated: 13-Aug-13 00:19 by Jonn Shingles (MD)

## 2014-12-22 NOTE — Consult Note (Signed)
Comments   Met with pt's daughter, Caryl Pina, and pt's friend, Margreta Journey. Updated them on pt's medical status including fall. They both agree patient has declined dramatically and now feel that she is likely approaching end of life. Both agree with discharge to the [facility] and ask that we make patient comfortable. Note patient's daughter lives and works in [place]. Have called and left a message for possible referral to [facility] in [place]. Alternatively family may decide on transfer to [facility] in [place] as [facility] cannot provide for lengthy care if pt has a delayed terminal process.  written will d/c MRI. Chaplain requested for family. CM updated.  30 additional minutes  Electronic Signatures for Addendum Section:  Phifer, Izora Gala (MD) (Signed Addendum 13-Aug-13 13:21)  Discussed with Billey Chang, NP, in detail. Agree with assessment and plan as outlined in above note.   Electronic Signatures: Borders, Kirt Boys (NP)  (Signed 13-Aug-13 11:53)  Authored: Palliative Care   Last Updated: 13-Aug-13 13:21 by Phifer, Izora Gala (MD)

## 2014-12-22 NOTE — Discharge Summary (Signed)
PATIENT NAME:  Vicki Sanders, Vicki Sanders MR#:  [id number] DATE OF BIRTH:  19-Feb-1966  DATE OF ADMISSION:  04/09/2012 DATE OF DISCHARGE:  04/16/2012  DISPOSITION: To hospice home on 04/16/2012. Patient will be disposed to hospice home. We don't know where she wants to go, either hospice home in [place] or hospice home in [place] so case manager is working on that.   CODE STATUS: DO NOT RESUSCITATE.   DISCHARGE DIAGNOSES:  1. Metastatic breast cancer, terminal.   2. DO NOT RESUSCITATE.   MEDICATIONS:  1. Roxanol 0.5 to 1 mL as needed every 1 to 2 hours p.r.n. 2. Ativan 0.5 to 1 mg sublingual or p.o. q.2-4 house p.r.n. for anxiety and agitation.   CONSULTATIONS:  1. ID consult with Dr. Clayborn Bigness.  2. Surgical consult with Dr. Rexene Edison.   3. Palliative care consult with Dr. Ermalinda Memos.  4. Oncology consult with Dr. Leia Alf.  5. Wound care consult.   HOSPITAL COURSE: 50 year old female patient with history of stage IV breast cancer diagnosed two months ago on palliative chemotherapy brought by the family because patient was swelling and also confused, noted to have bruising around the eyes and face and also cellulitis worsening in groin area. Patient had no fever. Patient is admitted to hospitalist service for cellulitis and also altered mental status and metabolic encephalopathy. Patient started on IV vancomycin and Diflucan. ID consult obtained with Dr. Clayborn Bigness. She also received some topical antifungals. She did not have any fever. Her blood cultures shows group B strep in 3 of 4 bottles on admission and the repeat blood cultures from 08/06 did not show any growth. Patient had an echocardiogram which did not show any vegetations. Patient seen by Dr. Clayborn Bigness who recommended surgical evaluation because of her diabetes and to evaluate for necrotizing fasciitis. Patient was continued on vancomycin and stopped the Levaquin and Diflucan. Patient seen by Dr. Rexene Edison who suggested that the wound  infection in her groin is not suggestive of necrotizing fasciitis. He suggested wound care consult and also nystatin powder. Patient advised no surgical intervention. Wound care people have seen the patient and advised  coloplast for  skin damage resulting in skin to skin contact so she has been getting that. She is on morphine around the clock for pain. Patient's antibiotics are changed to IV ampicillin for group B strep bacteremia. Repeat blood cultures have been negative. She was afebrile so she was getting IV ampicillin. Patient did tolerate penicillin even though she had some penicillin allergy status before. Despite the antibiotics her rash still the same. She did not improve much. She needed more pain medications.   Metastatic breast cancer, seen by Dr. Leia Alf. Patient follows up with Dr. Humphrey Rolls in [place]. Because of poor improvement Dr. Humphrey Rolls and Dr. Leia Alf had a conversation yesterday. Patient had stage II breast cancer diagnosed in June with liver, lung, bone mets on palliative chemotherapy. Patient's last chemotherapy was on 06/28 and significant thrombocytopenia despite not getting cytotoxics for more than six weeks. There was some concern about bone marrow involvement. Dr. Ma Hillock and Dr. Humphrey Rolls suggested MRA of the brain but patient is very lethargic and needing more pain medications and was confused and was pulling out the lines. Patient's condition discussed with the daughter and she was thinking about hospice home. Palliative care has seen the patient and because of her overall situation with multiple mets, thrombocytopenia, cellulitis, patient not making any progress decided to transfer the patient to hospice home. She will be going to  hospice home. Case manager is working to see if she can get hospice home near [place] or [place] area because daughter works in [place]. If not she will go to hospice home in [place].   Because of her advanced breast cancer she is not a candidate  for ongoing oncological care and daughter agreed for hospice home and had a detailed discussion with her.   CODE STATUS: DO NOT RESUSCITATE.   TIME SPENT ON DISCHARGE PREPARATION: More than 30 minutes.  ____________________________ Epifanio Lesches, MD sk:cms D: 04/16/2012 14:09:15 ET T: 04/17/2012 13:02:37 ET JOB#: [id number]  cc: Epifanio Lesches, MD, <Dictator> Epifanio Lesches MD ELECTRONICALLY SIGNED 05/01/2012 14:43

## 2014-12-22 NOTE — H&P (Signed)
PATIENT NAME:  Vicki Sanders, Vicki Sanders MR#:  [id number] DATE OF BIRTH:  October 26, 1965  DATE OF ADMISSION:  04/09/2012  PRIMARY CARE PHYSICIAN:  Dr. Humphrey Rolls in [place] ONCOLOGIST: Dr. Humphrey Rolls in [place].   CODE STATUS: DO NOT RESUSCITATE.   CHIEF COMPLAINT: Altered mentation.   HISTORY OF PRESENT ILLNESS: This is a 49 year old female who has a known history of stage IV metastatic breast cancer diagnosed approximately two months ago. She is on chemotherapy. The patient's breast cancer has metastasized to her liver, to bones and to her lungs. She resides at nursing home. Per her daughter, she last saw her mom on Thursday. At that time, mom was awake, alert and oriented. Since having chemotherapy, the patient has been having nausea and vomiting chronically with a poor appetite. She has become a fall risk. Since Thursday the patient has been falling frequently. When she was seen today, the patient had extensive bruising around the eyes and the face. The patient was also very confused. She has extensive and worsening cellulitis on her pannus and in the groin region. There are no reports of any fever or any diarrhea. The patient is unable to provide any further details due to her altered mentation. The daughter requested that the patient be taken into the ER. History provided mainly by daughter who is present at the bedside.   REVIEW OF SYSTEMS: Unable to obtain.   PAST MEDICAL HISTORY:  1. Dyslipidemia. 2. Low blood pressure. 3. Diabetes mellitus.  4. Stage IV breast cancer with mets to the liver, bones and lungs.  PAST SURGICAL HISTORY: Appendectomy.   MEDICATIONS: 1. Dexamethasone 4 mg 2 tablets twice daily.  2. Diazepam 5 mg q.6 hours p.r.n.  3. Colace 100 mg orally twice daily p.r.n.  4. Fentanyl 50 mcg every 72 hours.  5. Zofran 8 mg twice daily as well as p.r.n.  6. Oxycodone 5 mg every four hours p.r.n. for mild to moderate pain.  7. Oxycodone 5 mg 2 tabs q.4 hours p.r.n. severe pain   8. MiraLAX daily.  9. Promethazine 10 mg q.6 hours p.r.n. nausea and vomiting. 10. Ranitidine 75 mg 2 tablets twice daily.  11. Sucralfate 4 times daily. 12. Ambien 5 mg p.r.n. insomnia.   ALLERGIES: The patient is allergic to cephalosporin.    SOCIAL HISTORY: Former smoker. quit two months ago, currently negative tobacco, alcohol, or illicit drugs. The patient resides at [facility] in [place]. She now walks with a walker. She is a fall risk. The patient's daughter is the power of attorney. Her name is Earvin Hansen. Her phone number is [phone].   FAMILY HISTORY: Significant for diabetes mellitus, hypertension, dyslipidemia.   PHYSICAL EXAMINATION:  VITAL SIGNS: Blood pressure 103/95. Pulse 99, respirations 22, temperature 96.1.   GENERAL: Confused female, obese.   EYES: The patient does have some scleral icterus. Pupils are equal, round, and reactive to light and accommodation. The patient has raccoon eyes, tap bruising around both eyes. She also has bruising on the right cheek.   ENT: Dry oral mucosa. Trachea midline.   NECK: Supple.   LUNGS: Clear. No wheeze appreciated. No use of accessory muscles.   CARDIOVASCULAR: Regular rate and rhythm without murmurs, regurgitation or gallops. No JVD appreciated.   ABDOMEN: Obese, soft, positive bowel sounds. Unable to assess organomegaly due to the patient's body habitus.   NEUROLOGIC: Cranial nerves II through XII appear grossly intact. Unable to assess mentation due to the patient's mentation.   MUSCULOSKELETAL: Strength five out of five  in all extremities. The patient does have 3+ bilateral lower extremity pitting edema.   SKIN:  1. See above with eyes.  2. The patient has extensive cellulitis under her pannus and in the groin area extending almost down to the thighs.   LABORATORY, RADIOLOGICAL AND DIAGNOSTIC DATA: White blood count 4.4, hemoglobin 9.5, and platelets 51. There are no baseline labs for  comparison. Glucose 408, BUN 22, creatinine 1.25, sodium 141, potassium 3.8, chloride 107, CO2 22, calcium 7.7, total bilirubin elevated at 9.6, alkaline phosphatase 299, ALT 75, AST 84, total protein 4.3. Urinalysis is negative. Chest x-ray: No acute disease. CT of head shows no definite acute intracranial hemorrhage or skull fracture. EKG: Junctional rhythm. No ST segment changes. QTc interval corrected for 64.   ASSESSMENT AND PLAN:  1. Cellulitis within the groin and pannus. The patient will be admitted. Vancomycin and Diflucan have been ordered. The patient's cellulitis does appear to be yeastlike in nature. A Foley has been ordered to prevent further skin breakdown as the patient does appear to be incontinent, more so now with her altered mentation.  2. Diabetes mellitus, uncontrolled, likely worsened by the fact that the patient is on chronic steroid therapy. The patient was placed on sliding-scale insulin. Will start the patient on Lantus. The patient will be on an ADA diet. The first dose of Lantus will be given now.  3. Transaminitis. As stated, baseline labs are not available. However, the patient does have known history of breast cancer with mets to the liver. Will order a complete metabolic panel for the a.m. Further imaging studies have not been ordered at this point. If LFTs continue to rise, will defer to a.m. team. Further imaging as needed.  4. Altered mentation/encephalopathy, likely multifactorial. Will check an ammonia level. May be due to infection. May be due to the patient's ongoing chemotherapy. The patient has no evidence of mets to the brain based on CT of the head. IV fluid hydration. Continue to evaluate for improvement.  5. Bilateral lower extremity edema. The patient does have hypoalbuminemia. The patient's albumin is 1.8. Lasix has been ordered. Will monitor fluid status.  6. Severe protein calorie malnutrition. Nutrition consult has been requested.  7. Thrombocytopenia,  likely also due to the patient's liver mets. Deep vein thrombosis prophylaxis has not been ordered. For this reason plus the fact that the patient has multi-bruising, therefore, SCDs have been ordered.  8. Stage IV breast cancer with mets.  9. Dyslipidemia. Home medications have been ordered. The patient's pain regimen has been continued. However, some of her medications have been discontinued. Specifically, her Ambien. Her diazepam dose has been decreased as has been her fentanyl patch dose and her pain medication. This is done because of the patient's altered mentation. These can be restarted and titrated back up as the patient's mentation clears.  10. SCDs for deep vein thrombosis prophylaxis. Continue ranitidine for gastrointestinal prophylaxis.   CODE STATUS: The patient is a DO NOT RESUSCITATE.   ____________________________ Quintella Baton, MD dc:ap D: 04/09/2012 01:28:04 ET T: 04/09/2012 08:42:58 ET JOB#: [id number]  cc: Quintella Baton, MD, <Dictator> Dr. Wonda Olds, [place] Quintella Baton MD ELECTRONICALLY SIGNED 04/18/2012 12:17

## 2014-12-22 NOTE — Consult Note (Signed)
Impression: 49yo F w/ h/o metastatic breast CA admitted with ulcerative lesions under the pannus and Grp B Strep sepsis.  She remains confused and is a difficult historian.  Her skin ulcers are painful.  Diabetics with Grp B Strep infection can develop necrotizing fasciitis.  This often has areas of anesthesia and is rapidly fatal if not treated surgically.  Were she to have necrotizing fasciitis, it would likely be a terminal event.  Will ask surgery to evaluate her, but I suspect that this is more cellulitic with bacteremia. Will continue the vanco until the sensitivities are available. d/c levofloxacin and fluconazole.  Continue topical fungal coverage. Would aggresively treat her pain.  Electronic Signatures: Evelena Masci, Heinz Knuckles (MD)  (Signed on 07-Aug-13 15:09)  Authored  Last Updated: 07-Aug-13 15:09 by Coal Nearhood, Heinz Knuckles (MD)

## 2014-12-22 NOTE — Consult Note (Signed)
PATIENT NAME:  Vicki Sanders, CAVAN MR#:  [id number] DATE OF BIRTH:  1966/07/24  DATE OF CONSULTATION:  04/11/2012  REFERRING PHYSICIAN:  Dr. Dustin Flock  CONSULTING PHYSICIAN:  Omolola Mittman R. Ma Hillock, MD  REASON FOR CONSULTATION: Patient with known metastatic breast cancer with progressive decline.   HISTORY OF PRESENT ILLNESS: Patient is a 49 year old female with recently diagnosed widely metastatic stage IV breast cancer diagnosed 02/14/2012 by needle core biopsy of right breast mass in [place], per records from oncologist, Dr. Humphrey Rolls in [place], tumor is ER + 5%, PR negative. HER-2/neu overexpressed. Patient has widespread metastasis involving liver, lung and bones. She has started on palliative chemotherapy with Taxotere/carboplatin/Herceptin. From note on 04/03/2012 it appears that she was given Herceptin only. According to patient's daughter, patient received chemotherapy with Taxotere, carboplatin the week before and it appears that she receives Neulasta injections the day after chemotherapy. Currently she is admitted to the hospital 08/06 with altered mentation, and was found to have cellulitis in the groin and pannus areas and has been started on IV antibiotics, also has had poorly controlled diabetes. Currently patient's mental status was improved, she is able to converse appropriately and is oriented x4. States that the redness and pain in the areas of cellulitis is remarkably improved. She is still weak but ambulates to bathroom. States that she had gained about 90 pounds of fluid retention which is beginning to improve now. Serum albumin has been low at 1.8 lately.   PAST MEDICAL HISTORY/SURGICAL HISTORY:  1. Stage IV breast cancer as described above.  2. Diabetes mellitus.  3. Hyperlipidemia.  4. Low blood pressure.  5. Appendectomy.   FAMILY HISTORY: Remarkable for hypertension and diabetes. Denies malignancy.   SOCIAL HISTORY: Ex-smoker, quit two months ago. Denies history of  alcohol or recreational drug usage. Resides at [facility] in [place]. Uses walker to ambulate.    HOME MEDICATIONS:  1. Diazepam 5 mg q.6 hours p.r.n.  2. Colace 100 mg b.i.d. p.r.n.  3. Dexamethasone 4 mg b.i.d. around days of chemotherapy. 4. Fentanyl 50 mcg patch every 72 hours.  5. Zofran 8 mg b.i.d. plus p.r.n.  6. Oxycodone 5 to 10 mg q.4 hours p.r.n. for pain.  7. MiraLax daily.  8. Promethazine 10 mg q.6 hours p.r.n. for nausea, vomiting.  9. Zantac 75 mg 2 tablets b.i.d.  10. Sucralfate 4 times daily.  11. Ambien 5 mg at bedtime p.r.n. for insomnia.   ALLERGIES: Cephalosporins.   REVIEW OF SYSTEMS: CONSTITUTIONAL: Has generalized weakness. Currently denies fever or chills. HEENT: Denies headaches, dizziness, epistaxis, ear or jaw pain. No new sinus symptoms. CARDIAC: Currently no chest pain, orthopnea, palpitations, or dyspnea at rest. Has generalized swelling which is improving. LUNGS: Has mild dyspnea on exertion. No progressive cough, sputum, hemoptysis, or chest pain. GASTROINTESTINAL: No nausea or vomiting. No diarrhea. No blood in stools. GENITOURINARY: No dysuria or hematuria. MUSCULOSKELETAL: Has some back pain which she says unchanged. No new bone pains otherwise. HEMATOLOGIC: No obvious bleeding symptoms. SKIN: As in history of present illness. NEUROLOGIC: As in history of present illness. No new focal weakness, seizures, or paresthesias in extremities. ENDOCRINE: No polyuria, polydipsia. Appetite is better today.   PHYSICAL EXAMINATION:  GENERAL: Patient is weak and tired looking, resting, otherwise alert and oriented to self, place, person, and time. Converses appropriately. No icterus.   VITAL SIGNS: Temperature 94.6, pulse 85, respirations 16, blood pressure 109/71, 98% on room air.   HEENT: Normocephalic, atraumatic. Sclera anicteric. Resolving bruise over face.  NECK: Supple, no bulky lymphadenopathy.   CARDIOVASCULAR: S1, S2, regular rate and rhythm.    LUNGS: Lungs show bilateral good air entry, decreased at bases, no rhonchi.   ABDOMEN: Obese, soft, nontender. No major erythema or skin ulceration except minimally seen in the groin areas now.   EXTREMITIES: Bilateral pitting pedal edema present. No cyanosis.   NEUROLOGIC: Limited examination. Cranial nerves are intact. Moves all extremities spontaneously.   MUSCULOSKELETAL: No obvious joint deformity or swelling.   LABORATORY, DIAGNOSTIC AND RADIOLOGICAL DATA: CBC from today shows WBC 3200 with 88% neutrophils (ANC around 2500), hemoglobin 9.1, platelets 42, creatinine 1.21, potassium 3.8. Liver functions shows bilirubin 7.3, alkaline phosphatase 294, AST 78, ALT 74, albumin very low at 1.7. Echo Doppler showed ejection fraction of 55%, normal LV wall motion and LV wall thickness. Grossly no vegetations seen. Two sets of blood cultures on 08/05 growing Streptococcus agalactiae. Repeat blood culture from 08/06 is negative so far.   IMPRESSION AND RECOMMENDATIONS: 49 year old female patient with recently diagnosed stage IV widely metastatic breast cancer diagnosed 02/14/2012, has liver, lung and bone metastasis and is on palliative chemotherapy by Dr. Humphrey Rolls in [place], reportedly gets Taxotere/carboplatin/Herceptin. According to patient and her daughter, most recent dose of Taxotere and carboplatin was about 1-1/2 weeks ago, and per Dr. Laurelyn Sickle notes she does get Neulasta on the day after chemotherapy. Currently admitted to hospital with altered mental status, cellulitis of the groin and pannus areas, poorly controlled diabetes, worsening fluid retention. Symptoms of cellulitis is improving well, continue current antibiotic therapy. Blood culture initially showed Streptococcus agalactiae and repeat blood culture from 08/06 is negative so far, she is getting vancomycin IV. CBC shows pancytopenia, ANC, however, is adequate. Continue to monitor CBC daily and transfuse as indicated (if hemoglobin drops  to less than 7 or if severe anemia symptoms, and if platelet count drops to less than 20,000 or at higher counts if any major bleeding issues). Oncology will continue to follow as needed. Patient and daughter states that once discharged, they already have appointment with Dr. Humphrey Rolls and plan to keep it at [facility].   Thank you for the referral. Please fee free to contact me if any additional questions.  ____________________________ Rhett Bannister Ma Hillock, MD srp:cms D: 04/12/2012 06:56:00 ET T: 04/12/2012 07:21:52 ET JOB#: [id number]  cc: Greco Gastelum R. Ma Hillock, MD, <Dictator> Alveta Heimlich MD ELECTRONICALLY SIGNED 04/12/2012 11:17

## 2014-12-22 NOTE — Consult Note (Signed)
PATIENT NAME:  Vicki Sanders, Vicki Sanders MR#:  [id number] DATE OF BIRTH:  Oct 01, 1965  DATE OF CONSULTATION:  04/10/2012  REFERRING PHYSICIAN:   CONSULTING PHYSICIAN:  Valentine Kuechle A. Shelbie Franken, MD  REASON FOR CONSULTATION: Evaluation of ulcerative lesions in pannus and groin creases as well as under bilateral breasts.   CHIEF COMPLAINT: Pain secondary to ulcerative lesions and cellulitis under her pannus and breasts.  HISTORY OF PRESENT ILLNESS: Ms. Casselman is an unfortunate 49 year old female who has a known history of stage IV metastatic breast cancer diagnosed approximately two months ago. She is undergoing chemotherapy, last dose was last week. Per report it is in her liver, bones and lungs. She was noted by her daughter, again the timeframe is unknown because Ms. Urick is visually uncomfortable and has trouble answering questions. When she was seen yesterday she was known to have ulcerative lesions in her groin and under her pannus and under bilateral breasts. She reports that these have been hurting her but she never really noticed it until her daughter saw them two days ago. She has been treated for GBS septicemia with vancomycin and topical nystatin powder.  She was brought to the ER for her cellulitis and new onset altered mental status.  She reports otherwise no fever, chills, cough, chest pain, shortness of breath, nausea, vomiting, diarrhea, constipation, dysuria or hematuria.Marland Kitchen  REVIEW OF SYSTEMS: Difficult to obtain due to patient mental status and confusion.  Pertinent positive and negatives above.  PAST MEDICAL HISTORY: 1. Stage IV breast cancer with metastatic lesions to liver, bone and lungs. 2. Diabetes mellitus.  3. Group B strep sepsis which was found in her blood taken from 08/05.  4.  Cellulitis with ulcers in bilateral groins, under pannus and under breasts.  PAST SURGICAL HISTORY: Appendectomy.  CURRENT MEDICATIONS:  1. Dexamethasone 8 p.o. b.i.d. 2. Lasix 20 mg IV q.12  hours.  3. Lantus injection 15 sub-Q at bedtime. 4. Novolin R sliding scale.  5. Nystatin topical powder to groin area and below breasts.  6. Zofran 8 mg p.o. q.8 hours p.r.n.  7. MiraLax powder 17 p.o. daily p.r.n.  8. Zantac 140 p.o. q.12 hours.  9. Carafate 1 gram p.o. t.i.d.  10. Vancomycin 1.75 IV q.24 hours.  11. Fentanyl patch 25 mcg q.3 days. 12. Prozac 20 mg p.o. daily.  13. Morphine 2 to 4 mg IV push q.2 hours p.r.n. pain. 14. Temazepam 7.5 mg p.o. at bedtime insomnia.   ALLERGIES: Allergic to cephalosporin.  SOCIAL HISTORY:  Per previous history and physical history of tobacco abuse but no longer. Patient's daughter is power of attorney.   FAMILY HISTORY: Significant for diabetes, hypertension, dyslipidemia.  PHYSICAL EXAMINATION: CURRENT VITAL SIGNS: Temperature 97.4, pulse 84, blood pressure 117/68, pulse ox 96.   GENERAL: Appears uncomfortable but interactive. Slightly confused.   HEAD: Normocephalic. Has periorbital and perioral bruising.  EYES: + scleral icterus.  No conjunctivitis.   NECK: Supple.  No palpable masses.  CHEST: Lungs clear to auscultation. Moving air well.  CARDIOVASCULAR: Regular rate and rhythm. No murmurs, rubs or gallops.   BREASTS: Has bilateral cellulitis with small areas of ulceration under both breasts. There also appears to be some punctate areas that may be consistent with candidal infection. Both areas are moist with some purulence.  ABDOMEN: Soft, nontender, nondistended.  GROIN/PANNUS: Bilateral areas of punctate erythema and bleeding points with areas of what looks like superficial skin necrosis and ulcerations, very moist, no obvious bulla. Minimal erythema. No fluctuance.  No dish water  fluid.  EXTREMITIES: Moves all extremities well but has difficulty moving secondary to pain and body habitus.  LABORATORY, DIAGNOSTIC AND RADIOLOGICAL DATA: White cell count 3.3 with 91% neutrophils. Recent comprehensive metabolic panel from  87/19 significant for glucose 377 and bilirubin 9.3, alkaline phosphatase 3.04.   Imaging: None.  ASSESSMENT AND PLAN: Ms. Gaut is an unfortunate 50 year old female with history of metastatic breast cancer who has recently been on chemotherapy. She is blood culture positive for group B strep and has ulcerative lesions in her groin and below her pannus and under her breasts. This is not consistent with necrotizing soft tissue infection on my examination. She should continue her nystatin powder. I would recommend wound care for help with keeping these areas dry. Even though she does have some areas of ulceration I would not debride at this time for fear of making them deeper and more difficult to heal. Would continue antibiotics. I will continue to follow her.   ____________________________ Glena Norfolk. Harry Bark, MD cal:cms D: 04/10/2012 17:59:31 ET T: 04/11/2012 07:28:34 ET JOB#: [id number]  cc: Harrell Gave A. Mckinzey Entwistle, MD, <Dictator>  Floyde Parkins MD ELECTRONICALLY SIGNED 04/11/2012 16:57

## 2014-12-22 NOTE — Consult Note (Signed)
PATIENT NAME:  Vicki Sanders, Vicki Sanders MR#:  [id number] DATE OF BIRTH:  10-23-1965  DATE OF CONSULTATION:  04/10/2012  REFERRING PHYSICIAN:  Dustin Flock, MD CONSULTING PHYSICIAN:  Heinz Knuckles. Kashon Kraynak, MD  REASON FOR CONSULTATION: Group B strep bacteremia.   HISTORY OF PRESENT ILLNESS: The patient is a 49 year old female with a past history significant for metastatic breast cancer, on palliative chemotherapy, and diabetes who was admitted on 04/09/2012 with mental status changes. The patient is an extremely poor historian and has a very difficult time answering questions due to confusion and pain. History is obtained mainly from the chart. The patient has been living in a nursing home and has had chronic nausea, vomiting, and decreased appetite; however, she had been falling more recently and has had multiple areas of bruising. She has also developed worsening confusion. She is complaining of pain in the abdomen under the pannus and has multiple ulcerations there. She was admitted to the hospital and started on vancomycin, Levaquin, and fluconazole. She is also receiving topical antifungals. The patient denied any fevers, but when asked further questions she would not answer and then would seem to forget what the question was despite repeated questioning.   ALLERGIES: Cephalosporins.   PAST MEDICAL HISTORY:  1. Diabetes.  2. Metastatic breast cancer status post palliative chemotherapy. It is unclear when her most recent chemotherapy was.  3. Hypercholesterolemia.  4. Hypotension   SOCIAL HISTORY: The patient is a prior smoker. She does not drink alcohol. No injecting drug use. She is a nursing home resident. She has been walking with a walker but has been falling more recently.   FAMILY HISTORY: Positive for diabetes, hypertension, and hypercholesterolemia.  REVIEW OF SYSTEMS: Unable to obtain from the patient due to her confusion and pain.    PHYSICAL EXAMINATION:   VITAL SIGNS: T-max 97.7,  T-current 97.4, and her temperature has been as low as 93.6, pulse 84, blood pressure 117/68, and 96% on room air. She has had some systolic blood pressures less than 100.   GENERAL: A 49 year old white female appearing moderate to severely uncomfortable and occasionally writhing in pain in bed.   HEENT: Normocephalic. She has evidence for trauma with bruising around her eyes and nose. Extraocular motion intact. Sclerae, conjunctivae, and lids are without evidence for emboli or petechiae. She did have scleral icterus present bilaterally. Oropharynx shows no erythema or exudate. Gums are in fair condition.   NECK: Midline trachea. No lymphadenopathy. No thyromegaly.   LUNGS: Clear to auscultation bilaterally with good air movement. No focal consolidation.   HEART: Regular rate and rhythm without murmur, rub, or gallop.   ABDOMEN: Soft, mildly tender in the mid epigastric area. No rebound or guarding. No hepatosplenomegaly was appreciated, but the exam was somewhat difficult. She had significant pain over the skin under the pannus and in that area there was redness and ulcerative lesions, most ulcers were relatively shallow. There was some whitish drainage in these areas. This extended from the pannus on the side and across the midline into the other side. It also went into the groin slightly. There were no areas anaesthesia. She had no hernias noted.   EXTREMITIES: No evidence for tenosynovitis.   SKIN: Mildly jaundice. No other rashes are present other than the ulcers under the pannus with surrounding erythema. There was no stigmata of endocarditis, specifically no Janeway lesions nor Osler nodes.   NEUROLOGIC: The patient was confused and had difficulty processing information. She appeared to be in significant amount of  pain.  PSYCHIATRIC: Difficult to assess due to her level of pain.  LABORATORY AND RADIOLOGIC DATA: BUN 21, creatinine 1.12, bicarbonate 22, anion gap 11, AST 83, ALT 74,  alkaline phosphatase 304, and total bilirubin 9.3. White count 3.3, hemoglobin 8.2, platelet count 47, 91% neutrophils, and 1% bands. White count on admission was 4.4.   Urinalysis was unremarkable.  Urine culture is negative.   Blood cultures from admission are growing group B strep in three of four bottles, sensitivities of which are pending.   Repeat blood cultures from 03/09/2012 are pending.   Chest x-ray shows interstitial pulmonary opacities.   CT scan of the head without contrast showed no abnormalities.   IMPRESSION: A 49 year old female with a history of metastatic breast cancer and diabetes who was admitted with ulcerative lesions under the pannus and group B strep sepsis.   RECOMMENDATIONS:  1. She remains confused and is a difficult historian. Her skin ulcers are painful. Diabetics with group B strep infection can develop necrotizing fasciitis. This often has areas of anaesthesia and is rapidly fatal if not treated surgically. Were she to have necrotizing fasciitis, it would likely be a terminal event. We will ask surgery to evaluate her, but I suspect that this is more cellulitic with bacteremia. We will continue the vancomycin until the sensitivities are available. We will discontinue the Levaquin and the fluconazole. We will continue topical fungal coverage.  2. Would aggressively treat her pain.           This is a high-level infectious disease consult. Thank you very much for involving me in Ms. Murch's care.   ____________________________ Heinz Knuckles. Aizah Gehlhausen, MD meb:slb D: 04/10/2012 15:24:32 ET T: 04/10/2012 16:49:59 ET JOB#: [id number]  cc: Heinz Knuckles. Cheralyn Oliver, MD, <Dictator> Shubh Chiara E Jun Rightmyer MD ELECTRONICALLY SIGNED 04/26/2012 15:11

## 2014-12-22 NOTE — Consult Note (Signed)
    Comments   Follow up visit made. Pt is in distress but cannot articulate the reason. She does have some physical pain but also has emotional pain. Have asked chaplain to see pt. Pt in agreement.  fentanyl was cut in half on admission (from 2mcg to 53mcg). On 2 short-acting pain meds (hydromorphone IV and oxycodone po). Will change to prn morphine IV as short-acting med. Continue fentanyl patch at current dose. Consider increasing back to 20mcg in AM if pain still not controlled.   Electronic Signatures for Addendum Section:  Borders, Kirt Boys (NP) (Signed Addendum 07-Aug-13 15:39)  Will also start SSRI and Lorrin Mais (she is on latter at home).   Electronic Signatures: Henrick Mcgue, Izora Gala (MD)  (Signed 07-Aug-13 15:31)  Authored: Palliative Care   Last Updated: 07-Aug-13 15:39 by Irean Hong (NP)

## 2016-08-29 ENCOUNTER — Other Ambulatory Visit: Payer: Self-pay | Admitting: Nurse Practitioner
